# Patient Record
Sex: Male | Born: 1945
Health system: Southern US, Community
[De-identification: ages and names within clinical notes are randomized; demographics above are authoritative.]

## PROBLEM LIST (undated history)

## (undated) DIAGNOSIS — I10 Essential (primary) hypertension: Secondary | ICD-10-CM

## (undated) DIAGNOSIS — C4490 Unspecified malignant neoplasm of skin, unspecified: Secondary | ICD-10-CM

## (undated) DIAGNOSIS — N189 Chronic kidney disease, unspecified: Secondary | ICD-10-CM

## (undated) DIAGNOSIS — D352 Benign neoplasm of pituitary gland: Secondary | ICD-10-CM

## (undated) DIAGNOSIS — Z9889 Other specified postprocedural states: Secondary | ICD-10-CM

## (undated) DIAGNOSIS — M1990 Unspecified osteoarthritis, unspecified site: Secondary | ICD-10-CM

## (undated) DIAGNOSIS — K219 Gastro-esophageal reflux disease without esophagitis: Secondary | ICD-10-CM

## (undated) DIAGNOSIS — C61 Malignant neoplasm of prostate: Secondary | ICD-10-CM

## (undated) DIAGNOSIS — R112 Nausea with vomiting, unspecified: Secondary | ICD-10-CM

## (undated) HISTORY — DX: Malignant neoplasm of prostate: C61

## (undated) HISTORY — PX: OTHER SURGICAL HISTORY: SHX169

## (undated) HISTORY — DX: Unspecified malignant neoplasm of skin, unspecified: C44.90

## (undated) HISTORY — PX: CHOLECYSTECTOMY: SHX55

## (undated) HISTORY — PX: EYE SURGERY: SHX253

## (undated) HISTORY — PX: CARPAL TUNNEL WITH CUBITAL TUNNEL: SHX5608

---

## 2003-01-02 ENCOUNTER — Encounter: Payer: Self-pay | Admitting: Orthopaedic Surgery

## 2003-01-05 ENCOUNTER — Encounter: Payer: Self-pay | Admitting: Orthopaedic Surgery

## 2003-01-05 ENCOUNTER — Ambulatory Visit (HOSPITAL_COMMUNITY): Admission: RE | Admit: 2003-01-05 | Discharge: 2003-01-06 | Payer: Self-pay | Admitting: Orthopaedic Surgery

## 2003-01-05 HISTORY — PX: OTHER SURGICAL HISTORY: SHX169

## 2003-01-06 ENCOUNTER — Encounter: Payer: Self-pay | Admitting: Orthopaedic Surgery

## 2004-02-01 ENCOUNTER — Encounter: Admission: RE | Admit: 2004-02-01 | Discharge: 2004-02-01 | Payer: Self-pay | Admitting: Orthopedic Surgery

## 2004-02-06 ENCOUNTER — Ambulatory Visit (HOSPITAL_COMMUNITY): Admission: RE | Admit: 2004-02-06 | Discharge: 2004-02-06 | Payer: Self-pay | Admitting: Internal Medicine

## 2004-02-22 ENCOUNTER — Ambulatory Visit (HOSPITAL_COMMUNITY): Admission: RE | Admit: 2004-02-22 | Discharge: 2004-02-22 | Payer: Self-pay | Admitting: Orthopedic Surgery

## 2004-03-12 ENCOUNTER — Ambulatory Visit (HOSPITAL_COMMUNITY): Admission: RE | Admit: 2004-03-12 | Discharge: 2004-03-13 | Payer: Self-pay | Admitting: Orthopedic Surgery

## 2004-03-12 ENCOUNTER — Encounter (INDEPENDENT_AMBULATORY_CARE_PROVIDER_SITE_OTHER): Payer: Self-pay | Admitting: Specialist

## 2009-02-22 ENCOUNTER — Ambulatory Visit (HOSPITAL_COMMUNITY): Admission: RE | Admit: 2009-02-22 | Discharge: 2009-02-22 | Payer: Self-pay | Admitting: Ophthalmology

## 2009-03-15 ENCOUNTER — Ambulatory Visit (HOSPITAL_COMMUNITY): Admission: RE | Admit: 2009-03-15 | Discharge: 2009-03-15 | Payer: Self-pay | Admitting: Ophthalmology

## 2010-08-30 LAB — HEMOGLOBIN AND HEMATOCRIT, BLOOD
HCT: 42.4 % (ref 39.0–52.0)
Hemoglobin: 14.7 g/dL (ref 13.0–17.0)

## 2010-08-30 LAB — BASIC METABOLIC PANEL
Calcium: 8.9 mg/dL (ref 8.4–10.5)
Chloride: 107 mEq/L (ref 96–112)
Creatinine, Ser: 0.99 mg/dL (ref 0.4–1.5)
GFR calc Af Amer: >60 mL/min (ref >60)

## 2010-10-11 NOTE — H&P (Signed)
NAME:  Dwayne [name], Dwayne [name]                          ACCOUNT NO.:  [id number]   MEDICAL RECORD NO.:  [id number]                   PATIENT TYPE:  OIB   LOCATION:  5008                                 FACILITY:  [facility]   PHYSICIAN:  Sharolyn Douglas, M.D.                     DATE OF BIRTH:  1945/07/07   DATE OF ADMISSION:  01/05/2003  DATE OF DISCHARGE:                                HISTORY & PHYSICAL   CHIEF COMPLAINT:  Neck and right upper extremity pain as well as numbness.   HISTORY OF PRESENT ILLNESS:  The patient is a 65 year old male status post a  fall at work back in March of this year. He fell off a ladder. At that time,  he had immediate onset of neck pain and right upper extremity pain and also  numbness into his ring and small finger. This has been persisting since then  despite conservative treatments including anti-inflammatory medications,  pain medications, activity modification, rest, and physical therapy.  Unfortunately, he continues to have severe pain that is limiting his  activities of daily living, quality of life, and ability to work. Therefore,  risks and benefits of the proposed surgery were discussed with the patient  by Dr. Noel Gerold as well as myself who indicated understanding and opted to  proceed.   ALLERGIES:  No known drug allergies.   MEDICATIONS:  Bextra 20 mg q.d.   PAST MEDICAL HISTORY:  Healthy.   PAST SURGICAL HISTORY:  1. Knee scope in 1997.  2. Shoulder surgery, details are unknown, in 1997.  3. Cholecystectomy in 2003.   SOCIAL HISTORY:  The patient denies tobacco use, denies alcohol use. He is  married. Works as a Curator. He has three children who are grown. His wife  is available to help him through his postoperative course.   FAMILY HISTORY:  Noncontributory.   REVIEW OF SYSTEMS:  The patient denies any fevers, chills, sweats, or  bleeding tendencies. CENTRAL NERVOUS SYSTEM:  Denies blurred vision, double  vision, seizures, headaches, or  paralysis. CARDIOVASCULAR:  Denies chest  pain, angina, orthopnea, claudication, or palpitations. PULMONARY:  Denies  shortness of breath, productive cough, or hemoptysis. GASTROINTESTINAL:  Denies nausea, vomiting, constipation, diarrhea, melena, or bloody stool.  GENITOURINARY:  Denies dysuria, hematuria, or discharge. MUSCULOSKELETAL:  As per HPI.   PHYSICAL EXAMINATION:  VITAL SIGNS:  Blood pressure 168/94, respirations 16  and unlabored, pulse is 72 and regular.  GENERAL APPEARANCE:  The patient is a 65 year old male who is alert and  oriented in no acute distress, well-nourished, well-groomed, appears stated  age; pleasant and cooperative to examination.  HEENT:  Head is normocephalic, atraumatic. Pupils are equal, round, and  reactive to light. Extraocular movements intact. Nares patent. Oropharynx  clear.  NECK:  Supple to palpation. No lymphadenopathy, thyromegaly, or bruits  appreciated.  CHEST:  Clear to auscultation bilaterally. No rales, rhonchi,  stridor, or  wheeze.  BREASTS:  Not pertinent, not performed.  HEART:  S1 and S2, regular rate and rhythm; no murmurs, gallops, or rubs  noted.  ABDOMEN:  Soft to palpation, nontender, nondistended, no organomegaly noted,  positive bowel sounds throughout.  GENITOURINARY:  Not pertinent, not performed.  EXTREMITIES:  The patient has right upper extremity decreased sensation in  his ring and small finger. He also has pain into his right upper extremity,  decreased cervical range of motion.  SKIN:  Intact without lesions or rashes.   RADIOLOGIC FINDINGS:  X-ray and MRI shows cervical spondylitic  radiculopathy.   IMPRESSION:  Cervical spondylitic radiculopathy, C5-6.   PLAN:  1. Admit to [facility] hospital on January 05, 2003 for an ACDF at C5-6. This     will be done by Dr. Sharolyn Douglas.  2. The patient's primary care physician is Dr. Charm Barges.        Verlin Fester, P.A.                       Sharolyn Douglas, M.D.    CM/MEDQ   D:  01/05/2003  T:  01/06/2003  Job:  [id number]

## 2010-10-11 NOTE — Op Note (Signed)
NAME:  Dwayne [name], Dwayne [name]                          ACCOUNT NO.:  [id number]   MEDICAL RECORD NO.:  [id number]                   PATIENT TYPE:  AMB   LOCATION:  DAY                                  FACILITY:  [facility]   PHYSICIAN:  Lionel December, M.D.                 DATE OF BIRTH:  [birth year]   DATE OF PROCEDURE:  02/06/2004  DATE OF DISCHARGE:                                 OPERATIVE REPORT   PROCEDURE:  Esophagogastroduodenoscopy with esophageal dilation followed  total colonoscopy.   INDICATIONS:  Dwayne [name] is a 65 year old Caucasian male who has had solid food  dysphagia 3 weeks following neck surgery for disk problems in August last  year. He had barium study. He had small sliding hiatal hernia. Barium passed  through his esophagus without any delay. He has noted some improvement since  he has been on Aciphex. He is undergoing diagnostic and therapeutic EGD. He  will also undergo screening colonoscopy. Procedure and risks were reviewed  with the patient and informed consent was obtained.   PREOPERATIVE MEDICATIONS:  Cetacaine spray for pharyngeal topical  anesthesia, Demerol 50 mg IV, Versed 6 IV mg in divided doses.   FINDINGS:  Procedure performed in endoscopy suite. The patient's vital signs  and O2 saturations were monitored during the procedure and remained stable.   PROCEDURE #1:  Esophagogastroduodenoscopy. The patient was placed in left  lateral position, and Olympus video scope was passed via oropharynx without  any difficulty into the esophagus.   Esophagus:  Mucosa of the esophagus was normal. UES was carefully examined  on the way out, and no web or stricture was noted. A squamocolumnar  junction, and there was a 3-cm sized sliding hiatal hernia. There was no  ring or stricture noted distally.   Stomach:  It was empty and distended very well with insufflation. Folds of  the proximal stomach were normal. Examination of the mucosa revealed focal  erythema and edema at  antrum with no erosions or ulcers noted. Pyloric  channel was patent. Angularis, fundus, and cardia were examined by  retroflexing the scope and were normal.   Duodenum:  Reached the bulb and revealed normal mucosa. The scope was passed  to the second part of the duodenum. Mucosa and folds were normal. Endoscope  was withdrawn.   Esophagus was dilated by passing 56-French Maloney dilator to full  insertion. Endoscope was passed again, and esophageal mucosa reexamined.  There was no mucosa disruption.  Endoscope was withdrawn, and the patient  prepared for procedure #2.   PROCEDURE #2. Colonoscopy. Rectal examination performed. No abnormality  noted on external or digital exam. Olympus video scope was placed in the  rectum and advanced into sigmoid colon and beyond. Preparation was  satisfactory. He had a few tiny diverticula at the sigmoid colon and two  more at mid transverse colon. Scope was advanced to the cecum which was  identified  by appendiceal orifice and ileocecal valve. Picture taken for the  record. As the scope was withdrawn, colonic mucosa was once again carefully  examined and was normal except there was a tiny polyp at the sigmoid colon  which was ablated via cold biopsy. The mucosa of the rest of the sigmoid  colon and rectum was normal. Scope was retroflexed to examine anorectal  junction, and small hemorrhoids were noted below the dentate line.  The  scope was straightened and withdrawn. The patient tolerated the procedure  well.   FINAL DIAGNOSES:  1.  Small sliding hiatal hernia. No evidence of esophagitis, ring, or web.      Esophagus dilated by passing 56-French Maloney dilator because of      history of dysphagia. No mucosa disruption noted.  2.  Antral gastritis.  3.  Few tiny diverticula at the sigmoid colon and transverse colon. Small      polyp ablated via cold biopsy from the sigmoid colon.  4.  External hemorrhoids.   RECOMMENDATIONS:  1.  He will  continue antireflux measures and Aciphex as before. Helicobacter      pylori serology will be checked.  2.  High-fiber diet.  3.  I will be contacting patient with results of blood tests and biopsy in      the next few days.      ___________________________________________                                            Lionel December, M.D.   NR/MEDQ  D:  02/06/2004  T:  02/06/2004  Job:  [id number]

## 2010-10-11 NOTE — Op Note (Signed)
NAME:  Dwayne [name], Dwayne [name]                          ACCOUNT NO.:  [id number]   MEDICAL RECORD NO.:  [id number]                   PATIENT TYPE:  OIB   LOCATION:  2550                                 FACILITY:  [facility]   PHYSICIAN:  Sharolyn Douglas, M.D.                     DATE OF BIRTH:  [birth year]   DATE OF PROCEDURE:  01/05/2003  DATE OF DISCHARGE:                                 OPERATIVE REPORT   PREOPERATIVE DIAGNOSIS:  Cervical spondylotic radiculopathy, C5-6.   POSTOPERATIVE DIAGNOSIS:  Cervical spondylotic radiculopathy, C5-6.   OPERATION PERFORMED:  1. Anterior cervical diskectomy C5-6 with decompression of the spinal cord     and nerve roots bilaterally.  2. Anterior cervical arthrodesis C5-6 with placement of a 7 mm Synthes     allograft prosthesis spacer packed with local autogenous bone graft.  3. Anterior cervical plating utilizing the Zimmer plating system.  4. Neuromonitoring utilizing somatosensory evoked potentials and EMG's.   SURGEON:  Sharolyn Douglas, M.D.   ASSISTANT:  Verlin Fester, P.A.   ANESTHESIA:  General endotracheal.   COMPLICATIONS:  None.   INDICATIONS FOR PROCEDURE:  The patient is a 65 year old male with  persistent neck and right upper extremity pain, numbness and tingling and  weakness.  His plain radiograph showed severe degenerative changes at C5-6  with osteophyte formation.  MRI scan shows moderate central and biforaminal  narrowing.  He has failed all conservative treatment modalities.  He has  elected to undergo anterior cervical diskectomy and fusion at C5-6 in hopes  of improving his symptoms.  Risks, benefits and alternatives were  extensively discussed with the patient.   DESCRIPTION OF PROCEDURE:  The patient was properly identified in the  holding area and taken to the operating room.  He underwent general  endotracheal anesthesia without difficulty.  He was given prophylactic  intravenous antibiotics.  All bony prominences were padded.  He  was  carefully placed on the operating room table with the Mayfield head rest.  Slight extension was applied to the neck.  Five pounds of halter traction  was placed.  The neck was prepped and draped in the usual sterile fashion.  A 3 cm incision was made in the left side of the neck at the level of the  cricoid cartilage.  Dissection was carried down sharply through the  platysma.  The interval between the sternocleidomastoid muscle and strap  muscles medially was developed down to the prevertebral space.  The C5-6  disk space was easily identified by the large anterior osteophytes.  A  spinal needle was placed and an x-ray taken to confirm its location.  The  longus colli muscle was elevated out over the C5-6 disk space bilaterally.  The deep Shadowline retractor was placed.  The surgical microscope was  prepped, draped and brought into the field.  The esophagus, trachea, carotid  sheath were identified  and protected at all times.  The large anterior  osteophytes were debrided using a Leksell rongeur.  The disk space was  completely collapsed.  A diskectomy was carried out back to the posterior  longitudinal ligament using curets and pituitary rongeurs.  Caspar pins were  placed in C5 and 6 vertebral bodies and distraction was applied.  A high  speed bur was used to remove the cartilaginous end plates and take down the  large hypertrophied uncovertebral joints.  Wide foraminotomies were carried  out using 2 mm Kerrison punch.  The posterior longitudinal ligament was  taken down.  The spinal cord was decompressed.  The posterior osteophytes  were undercut using a 2 mm Kerrison punch.  The wound was irrigated.  Bleeding was controlled with bipolar electrocautery and Gelfoam.  A 7 mm  Synthes allograft prosthesis spacer was then placed which was packed with  local autogenous bone graft collected from the bone spurs.  A Zimmer  anterior cervical plate was placed with four 14 mm screws.  We  ensured that  the locking mechanism engaged.  The wound was irrigated.  The esophagus,  trachea and carotid sheath were inspected.  There were no apparent injuries.  Intraoperative x-ray taken showed good positioning of the graft and plate.  Deep Penrose drain placed.  The platysma muscle closed with a 2-0 Vicryl,  subcutaneous layer closed with 3-0 Vicryl followed by a running 4-0  subcuticular Vicryl suture, benzoin and Steri-Strips.  Sterile dressing  applied.  Soft collar placed.  Patient extubated without difficulty,  transferred to recovery room in stable condition able to move his upper and  lower extremities.                                                Sharolyn Douglas, M.D.    MC/MEDQ  D:  01/05/2003  T:  01/05/2003  Job:  [id number]

## 2010-10-11 NOTE — Op Note (Signed)
NAME:  Dwayne [name], Dwayne [name] NO.:  [id number]   MEDICAL RECORD NO.:  [id number]          PATIENT TYPE:  OIB   LOCATION:  2550                         FACILITY:  [facility]   PHYSICIAN:  Dionne Ano. Gramig III, M.D.DATE OF BIRTH:  [birth year]   DATE OF PROCEDURE:  03/12/2004  DATE OF DISCHARGE:                                 OPERATIVE REPORT   PREOPERATIVE DIAGNOSIS:  1.  Cubital tunnel syndrome, right upper extremity.  2.  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   POSTOPERATIVE DIAGNOSIS:  1.  Cubital tunnel syndrome, right upper extremity.  2.  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   PROCEDURE:  1.  Cubital tunnel release and anterior ulnar nerve transposition, right      elbow.  2.  Medial antebrachial cutaneous neurolysis, right elbow.  3.  Ulnar nerve release at Guyon's canal (ulnar nerve decompression at the      wrist).  4.  Leriche sympathectomy right wrist with stripping of the ulnar nerve away      from the ulnar artery and exploration.  5.  Ulnar artery thrombosis resection of right wrist forearm region.   SURGEON:  Dionne Ano. Amanda Pea, M.D.   ASSISTANT:  Karie Chimera, P.A.-C.   COMPLICATIONS:  None.   ANESTHESIA:  General with preop axillary block.   COMPLICATIONS:  None.   DRAINS:  One.   INDICATIONS FOR PROCEDURE:  This patient is a very pleasant male who  presents with the above mentioned diagnosis.  I have counseled him in  regards to the risks and benefits of surgery including the risks of  infection, bleeding, anesthesia, damage to normal structures, and failure of  surgery to accomplish its intended goals.  I have gone over his pre and  postop treatment with him, possible treatment scenarios based upon  interoperative conditions, etc.  With this in mind, he desires to proceed.   PROCEDURE IN DETAIL:  The patient was seen by myself and anesthesia, taken  to the operating suite, and underwent  a smooth induction of general  anesthesia.  He was laid supine, appropriately padded, prepped and draped in  the usual sterile fashion with Betadine scrub and paint.  Once this was  done, the patient then underwent elevation of the tourniquet to 250 mmHg.  I  then performed an extended volar wrist incision ulnarly.  This crossed the  wrist and was done in zig-zag fashion.  I then identified the ulnar artery  and the ulnar nerve proximally.  The ulnar artery was then traced out from  proximal to distal well into the superficial arch.  I identified the ulnar  artery and during the course of this performed an ulnar nerve release at  Guyon's canal.  The patient had very tight fascial bands about the canal of  Guyon.  This was released without difficulty.  Following this, I then  mobilized the nerve.  I took care to resect the superficial branch,  primarily the ulnar digital nerve to the small finger.  Once the nerve was  decompressed, I then performed an Leriche  sympathectomy teasing away any  investments of the nerve from the ulnar artery.  Any crossing and transverse  feeders off the artery were coagulated.  The patient had a notable  thrombosis from the area of 4 cm.  The thrombus did not extend deep into the  superficial palmar arch.  I dissected the artery out until the arch was  apparent and once this was done, I then immobilized the artery with vessel  loops.  The Leriche sympathectomy was accomplished without difficulty and  all investments were removed from the artery.  Following this, I then  deflated the tourniquet.  The patient had excellent refill.  He had pulses  which were noted on Doppler about the small finger and ring finger which  were good and stable.  I was pleased with this and felt that given the fact  that the patient had good refill, excellent vascular flow, and pulses noted  by Doppler about the proper digital arteries, that a Leriche sympathectomy  procedure was the  best choice as well as resection of the ulnar artery.  I  then resected the ulnar artery along a 3-4 cm course.  This was totally  occluded.  The patient did have vessel clamps placed prior to this and I  noted there was adequate flow prior to resecting the artery.  The artery was  resected without difficulty.  The flow in the upper extremity was robust  about all fingertips and he had good pulses on Doppler.  Following this, I  then copiously irrigated and closed the wound with interrupted Prolene.  A  drain was not needed as hemostasis was excellent.  Following this, we then  performed elevation of the arm tourniquet to 250 mmHg.  An incision was made  posteromedially, a skin flap was swung up.  The patient did have some gap  between the olecranon and the posterior structures due to old bursal tissue  being present there.  I then mobilized an anterior skin flap and identified  the medial and the brachial cutaneous nerve, and performed a neurolysis of  the medial and brachial cutaneous nerve.  Multiple branches were identified  and swept out of harms way.  Once this was done, the ulnar nerve was picked  up proximally and released proximally and distally.  The arcade of Struthers  was released, the medial muscular septum was released and excised, the  cubital tunnel was released, and the superficial and deep heads of the FCU  were released.  This was done to my satisfaction without difficulty and  there were no complicating features.  Following this, we then performed  anterior ulnar nerve transposition.  This was done very gently and  meticulously.  The cubital tunnel was sewn closed with Vicryl as was the  dead space between the ulnar bursa and the olecranon.  Following this, I  copiously irrigated, deflated the tourniquet, excellent refill was noted,  pulses remained excellent about the fingers, and I then placed a Hemovac drain and closed the subcu.  The nerve sat tension free along the  flexor  pronator region and there were no complicating features.  I did tack down  the subcu to prevent subluxation and, of course, the cubital tunnel was  closed to prevent subluxation.  Following this, the wound was closed with  Vicryl followed by a subcuticular Prolene at the elbow.  Onc this was done,  a dressing was applied and the patient was placed in a splint without  difficulty.  He was transferred to the recovery room and given an additional  dose of Ancef and was noted to be in stable condition.  All sponge, needle  and instrument counts were reported as correct.  It has been a pleasure to  participate in his care and we look forward to participating in his  postoperative recovery.  He will be admitted for IV antibiotics, elevation,  pain management, and other postoperative needs.  All questions have been  encouraged and answered.       WMG/MEDQ  D:  03/12/2004  T:  03/12/2004  Job:  [id number]

## 2010-10-11 NOTE — Consult Note (Signed)
NAME:  Dwayne [name], Dwayne [name] NO.:  [id number]   MEDICAL RECORD NO.:  [id number]                   PATIENT TYPE:   LOCATION:                                       FACILITY:   PHYSICIAN:  Lionel December, M.D.                 DATE OF BIRTH:  Oct 17, 1945   DATE OF CONSULTATION:  DATE OF DISCHARGE:                                   CONSULTATION   GI CONSULTATION:   PHYSICIAN REQUESTING CONSULTATION:  Dr. Samuel Jester.   REASON FOR CONSULTATION:  Dysphagia, screening colonoscopy.   HISTORY OF PRESENT ILLNESS:  Dwayne [name] is a 65 year old Caucasian gentleman  who presents today with a several-month history of progressive dysphagia.  He has difficulty swallowing mostly solids foods and occasionally liquids.  When the symptoms occur, he feels food stuck in his upper esophageal region  and has to wait for it to go down.  Occasionally, he vomits this back up.  He is unable to wash it down with liquids at times.  He denies any  odynophagia, heartburn, nausea, abdominal pain, constipation, diarrhea,  melena or rectal bleeding.  He used to have acid regurgitation several years  ago, but really has not had much problem in the way of this.  He had a  barium pill esophogram on January 03, 2004, which revealed a slight amount of  penetration, but no aspiration.  He had a small hiatal hernia which was non-  obstructing.  A 13 mm barium tablet passed without difficulty.   CURRENT MEDICATIONS:  1.  Aciphex 20 mg daily.  2.  Topamax 100 mg daily.   ALLERGIES:  No known drug allergies.   PAST MEDICAL HISTORY:  History of migraines since work-related accident  where he fell on his head from a ladder.   PAST SURGICAL HISTORY:  He has had cervical disk surgery in August, 2004,  right rotator cuff repair seven years ago with repeat right shoulder surgery  in January of 2005 in which he was found to have a remote fracture due to a  work-related fall.   FAMILY HISTORY:   Mother had an aneurysm.  Father died of an MI.  Negative  for colorectal cancer and chronic GI illnesses.   SOCIAL HISTORY:  He has been married for 41 years and had three children.  He is employed with [employer].  He quit smoking 15-20 years  ago, with history of 15 years abuse.  He denies any alcohol use.   REVIEW OF SYSTEMS:  Please see HPI for GI.  CARDIOPULMONARY:  Denies any  chest pain or shortness of breath.   PHYSICAL EXAMINATION:  VITAL SIGNS:  Weight 206, height 5 feet, 8 inches.  Blood pressure 113/82, pulse 78.  GENERAL:  A pleasant, well-nourished, well-developed Caucasian male, in no  acute distress.  SKIN:  Warm and dry, no jaundice.  HEENT:  Conjunctivae are pink.  Sclerae are nonicteric. Oropharyngeal mucosa  moist and pink.  No lesions, erythema or exudate.  No lymphadenopathy or  thyromegaly.  CHEST:  Lungs are clear to auscultation.  CARDIAC:  Exam reveals regular rate and rhythm, normal S1 and S2.  No  murmurs, rubs or gallops.  ABDOMEN:  Positive bowel sounds, soft, nontender, nondistended.  No  organomegaly or masses.  EXTREMITIES:  No edema.   IMPRESSION:  Dwayne [name] is a 65 year old gentleman with a several month  history of worsening dysphagia primarily to solid foods.  Recent barium  esophogram did not reveal any esophageal strictures.  His symptoms were  occurring fairly regularly.  At times, he vomits food after it becomes  lodged.  It is unclear at this time of the cause of his symptoms.  Esophageal spasm, hypertensive upper esophageal sphincter, undetected  esophageal web ring stricture all remain a possibility.  In addition, he has  never had a colonoscopy for colorectal cancer screen, and he would like to  proceed with this at this time.  I discussed the risks and alternatives of  esophagogastroduodenoscopy (EGD) and colonoscopy with the patient, and he is  agreeable to proceed.   PLAN:  1.  He will continue Aciphex for now.  2.   Esophagogastroduodenoscopy with dilatation and colonoscopy in the near      future.   I would like to thank Dr. Samuel Jester for allowing [place] to take part in the  care of this patient.     ________________________________________  ___________________________________________  Tana Coast, P.A.                         Lionel December, M.D.   LL/MEDQ  D:  01/30/2004  T:  01/30/2004  Job:  [id number]   cc:   [address]  [place]  [place] [address]  Fax: [phone]

## 2011-06-09 ENCOUNTER — Other Ambulatory Visit: Payer: Self-pay | Admitting: Urology

## 2011-07-07 ENCOUNTER — Encounter (HOSPITAL_COMMUNITY): Payer: Self-pay | Admitting: Pharmacy Technician

## 2011-07-09 ENCOUNTER — Encounter (HOSPITAL_COMMUNITY)
Admission: RE | Admit: 2011-07-09 | Discharge: 2011-07-09 | Disposition: A | Discharge status: Home / Self Care | Payer: Medicare Other | Source: Ambulatory Visit | Attending: Urology | Admitting: Urology

## 2011-07-09 ENCOUNTER — Encounter (HOSPITAL_COMMUNITY): Payer: Self-pay

## 2011-07-09 HISTORY — DX: Other specified postprocedural states: Z98.890

## 2011-07-09 HISTORY — DX: Chronic kidney disease, unspecified: N18.9

## 2011-07-09 HISTORY — DX: Other specified postprocedural states: R11.2

## 2011-07-09 HISTORY — DX: Gastro-esophageal reflux disease without esophagitis: K21.9

## 2011-07-09 LAB — BASIC METABOLIC PANEL
BUN: 26 mg/dL — ABNORMAL HIGH (ref 6–23)
Calcium: 9 mg/dL (ref 8.4–10.5)
Creatinine, Ser: 0.84 mg/dL (ref 0.50–1.35)
GFR calc non Af Amer: 90 mL/min — ABNORMAL LOW (ref >90)
Glucose, Bld: 93 mg/dL (ref 70–99)

## 2011-07-09 LAB — CBC
MCH: 29.4 pg (ref 26.0–34.0)
MCHC: 34.5 g/dL (ref 30.0–36.0)
Platelets: 181 10*3/uL (ref 150–400)

## 2011-07-09 NOTE — Pre-Procedure Instructions (Signed)
07/09/11 Reported abnormal lab result to office of Dr Isabel Caprice regarding BUN of 26.

## 2011-07-09 NOTE — Patient Instructions (Signed)
20 CIAN COSTANZO  07/09/2011   Your procedure is scheduled on:  07/16/11 0830am-1130am  Report to [facility] at 0630 AM.  Call this number if you have problems the morning of surgery: [phone]   Remember:   Do not eat food:After Midnight.  May have clear liquids:until Midnight .  Marland Kitchen  Take these medicines the morning of surgery with A SIP OF WATER:    Do not wear jewelry,   Do not wear lotions, powders, or perfumes. .    Do not bring valuables to the hospital.  Contacts, dentures or bridgework may not be worn into surgery.  Leave suitcase in the car. After surgery it may be brought to your room.  For patients admitted to the hospital, checkout time is 11:00 AM the day of discharge.       Special Instructions: CHG Shower Use Special Wash: 1/2 bottle night before surgery and 1/2 bottle morning of surgery. Shower chin to [employer] and private parts with regular soap.     Please read over the following fact sheets that you were given: MRSA Information, Blood Transfusion Fact Sheet, coughing and deep breathing exercises, leg exercises

## 2011-07-15 NOTE — H&P (Signed)
Dwayne [name] is an 66 y.o. male.   Chief Complaint: Prostate cancer HPI: Dwayne [name]  is thought to have intermediate stage clinical stage T1c disease. He did have one tiny biopsy showing Gleason 7 which put him in the intermediate category versus favorable, but really his situation tends towards the more favorable side. The patient really has minimal voiding complaints. He does have some urgency issues but overall AUA symptom score of about 6. Preoperative SHIM score is 23/25. He has read quite a bit about prostate cancer. He came in here with a bias towards surgery and expressed and initial interest in proceeding down that road.  Past Medical History  Diagnosis Date  . PONV (postoperative nausea and vomiting)   . GERD (gastroesophageal reflux disease)   . Chronic kidney disease     prostate cancer     Past Surgical History  Procedure Date  . Other surgical history     right shoulder surgery   . Other surgical history     right knee arthroscopic surgery   . Other surgical history     neck surgery   . Cholecystectomy     No family history on file. Social History:  reports that he has quit smoking. He has quit using smokeless tobacco. He reports that he does not drink alcohol or use illicit drugs.  Allergies: No Known Allergies  No current facility-administered medications on file as of .   No current outpatient prescriptions on file as of .    No results found for this or any previous visit (from the past 48 hour(s)). No results found.  Review of Systems - Negative except heartburn and mild depression.  There were no vitals taken for this visit. General appearance: alert, cooperative and no distress Neck: no adenopathy and no JVD Resp: clear to auscultation bilaterally Cardio: regular rate and rhythm GI: soft, non-tender; bowel sounds normal; no masses,  no organomegaly Male genitalia: normal, penis: no lesions or discharge. testes: no masses or tenderness. no  hernias Extremities: extremities normal, atraumatic, no cyanosis or edema Skin: Skin color, texture, turgor normal. No rashes or lesions Neurologic: Grossly normal  Assessment/Plan Prostate cancer. For robotic prostatectomy. The patient was counseled about the natural history of prostate cancer and the standard treatment options that are available for prostate cancer. It was explained to him how his age and life expectancy, clinical stage, Gleason score, and PSA affect his prognosis, the decision to proceed with additional staging studies, as well as how that information influences recommended treatment strategies. We discussed the roles for active surveillance, radiation therapy, surgical therapy, androgen deprivation, as well as ablative therapy options for the treatment of prostate cancer as appropriate to his individual cancer situation. We discussed the risks and benefits of these options with regard to their impact on cancer control and also in terms of potential adverse events, complications, and impact on quiality of life particularly related to urinary, bowel, and sexual function. The patient was encouraged to ask questions throughout the discussion today and all questions were answered to his stated satisfaction. In addition, the patient was provided with and/or directed to appropriate resources and literature for further education about prostate cancer and treatment options.   We discussed surgical therapy for prostate cancer including the different available surgical approaches. We discussed, in detail, the risks and expectations of surgery with regard to cancer control, urinary control, and erectile function as well as the expected postoperative recovery process. The risks, potential complications/adverse events of radical  prostatectomy as well as alternative options were explained to the patient.   We discussed surgical therapy for prostate cancer including the different available surgical  approaches. We discussed, in detail, the risks and expectations of surgery with regard to cancer control, urinary control, and erectile function as well as the expected postoperative recovery process. Additional risks of surgery including but not limited to bleeding, infection, hernia formation, nerve damage, lymphocele formation, bowel/rectal injury potentially necessitating colostomy, damage to the urinary tract resulting in urine leakage, urethral stricture, and the cardiopulmonary risks such as myocardial infarction, stroke, death, venothromboembolism, etc. were explained. The risk of open surgical conversion for robotic/laparoscopic prostatectomy was also discussed.   45 minutes were spent in face to face consultation with patient today.    Amendment  We had a lengthy discussion today about the pros and cons of radiation therapy versus surgery. If radiation were chosen I do think he would be a good candidate for brachytherapy as a monotherapy. He does have some very small Gleason 7 disease but a PSA of less than 10, relatively small prostate and low volume disease. Certainly he is also a candidate for active surveillance. Obviously some increased concern with a Gleason 7, but again, that was just a microscopic area really in one out of three biopsies. There are obviously distinctive areas and disadvantages to all of these approaches and we did highlight this today. At this point he is not certain about a surgical approach. He is interested in seeing how things go from an active surveillance standpoint at least mentally. Obviously if he changes his mind, we could certainly send him to radiation oncology for consideration of brachytherapy or put him on the surgical schedule for robotic prostatectomy. I would probably rebiopsy him at 6 months rather than 12 if he continues with active surveillance and if he would like to do that we will set him up for a follow-up in 3 months with a PSA one week before. After  that we would consider on his second visit repeat biopsy to make sure that pathologically there wasn't any sampling errors.   Dwayne [name] S 07/15/2011, 3:28 PM

## 2011-07-16 ENCOUNTER — Inpatient Hospital Stay (HOSPITAL_COMMUNITY)
Admission: RE | Admit: 2011-07-16 | Discharge: 2011-07-17 | DRG: 708 | Disposition: A | Discharge status: Home / Self Care | Payer: Medicare Other | Source: Ambulatory Visit | Attending: Urology | Admitting: Urology

## 2011-07-16 ENCOUNTER — Other Ambulatory Visit: Payer: Self-pay | Admitting: Urology

## 2011-07-16 ENCOUNTER — Encounter (HOSPITAL_COMMUNITY): Payer: Self-pay | Admitting: Anesthesiology

## 2011-07-16 ENCOUNTER — Encounter (HOSPITAL_COMMUNITY): Payer: Self-pay | Admitting: *Deleted

## 2011-07-16 ENCOUNTER — Encounter (HOSPITAL_COMMUNITY)
Admission: RE | Disposition: A | Discharge status: Home / Self Care | Payer: Self-pay | Source: Ambulatory Visit | Attending: Urology

## 2011-07-16 ENCOUNTER — Inpatient Hospital Stay (HOSPITAL_COMMUNITY): Payer: Medicare Other | Admitting: Anesthesiology

## 2011-07-16 DIAGNOSIS — Z87891 Personal history of nicotine dependence: Secondary | ICD-10-CM

## 2011-07-16 DIAGNOSIS — K219 Gastro-esophageal reflux disease without esophagitis: Secondary | ICD-10-CM | POA: Diagnosis present

## 2011-07-16 DIAGNOSIS — N189 Chronic kidney disease, unspecified: Secondary | ICD-10-CM | POA: Diagnosis present

## 2011-07-16 DIAGNOSIS — C61 Malignant neoplasm of prostate: Principal | ICD-10-CM | POA: Diagnosis present

## 2011-07-16 HISTORY — PX: ROBOT ASSISTED LAPAROSCOPIC RADICAL PROSTATECTOMY: SHX5141

## 2011-07-16 LAB — TYPE AND SCREEN: ABO/RH(D): O NEG

## 2011-07-16 SURGERY — ROBOTIC ASSISTED LAPAROSCOPIC RADICAL PROSTATECTOMY
Anesthesia: General | Site: Abdomen | Wound class: Clean Contaminated

## 2011-07-16 MED ORDER — SODIUM CHLORIDE 0.9 % IV BOLUS (SEPSIS)
1000.0000 mL | Freq: Once | INTRAVENOUS | Status: AC
  Administered 2011-07-16: 1000 mL via INTRAVENOUS

## 2011-07-16 MED ORDER — LACTATED RINGERS IV SOLN: INTRAVENOUS | Status: DC

## 2011-07-16 MED ORDER — ACETAMINOPHEN 10 MG/ML IV SOLN
1000.0000 mg | Freq: Four times a day (QID) | INTRAVENOUS | Status: AC
  Administered 2011-07-16 – 2011-07-17 (×4): 1000 mg via INTRAVENOUS
  Filled 2011-07-16 (×3): qty 100

## 2011-07-16 MED ORDER — CEFAZOLIN SODIUM 1-5 GM-% IV SOLN
1.0000 g | Freq: Three times a day (TID) | INTRAVENOUS | Status: AC
  Administered 2011-07-16 (×2): 1 g via INTRAVENOUS
  Filled 2011-07-16 (×2): qty 50

## 2011-07-16 MED ORDER — NEOSTIGMINE METHYLSULFATE 1 MG/ML IJ SOLN
INTRAMUSCULAR | Status: DC | PRN
  Administered 2011-07-16: 4 mg via INTRAVENOUS

## 2011-07-16 MED ORDER — SODIUM CHLORIDE 0.9 % IR SOLN
Status: DC | PRN
  Administered 2011-07-16: 250 mL

## 2011-07-16 MED ORDER — MORPHINE SULFATE 10 MG/ML IJ SOLN: 2.0000 mg | INTRAMUSCULAR | Status: DC | PRN

## 2011-07-16 MED ORDER — STERILE WATER FOR IRRIGATION IR SOLN
Status: DC | PRN
  Administered 2011-07-16: 1500 mL

## 2011-07-16 MED ORDER — LACTATED RINGERS IV SOLN
INTRAVENOUS | Status: DC | PRN
  Administered 2011-07-16: 09:00:00

## 2011-07-16 MED ORDER — MIDAZOLAM HCL 5 MG/5ML IJ SOLN
INTRAMUSCULAR | Status: DC | PRN
  Administered 2011-07-16: 2 mg via INTRAVENOUS

## 2011-07-16 MED ORDER — BUPIVACAINE-EPINEPHRINE 0.25% -1:200000 IJ SOLN
INTRAMUSCULAR | Status: DC | PRN
  Administered 2011-07-16: 26 mL

## 2011-07-16 MED ORDER — HYDROCODONE-ACETAMINOPHEN 5-325 MG PO TABS: 1.0000 | ORAL_TABLET | Freq: Four times a day (QID) | ORAL | Status: AC | PRN

## 2011-07-16 MED ORDER — KCL IN DEXTROSE-NACL 10-5-0.45 MEQ/L-%-% IV SOLN
INTRAVENOUS | Status: DC
  Administered 2011-07-16: 17:00:00 via INTRAVENOUS
  Filled 2011-07-16 (×6): qty 1000

## 2011-07-16 MED ORDER — EPHEDRINE SULFATE 50 MG/ML IJ SOLN
INTRAMUSCULAR | Status: DC | PRN
  Administered 2011-07-16: 5 mg via INTRAVENOUS

## 2011-07-16 MED ORDER — ONDANSETRON HCL 4 MG/2ML IJ SOLN
INTRAMUSCULAR | Status: DC | PRN
  Administered 2011-07-16: 4 mg via INTRAVENOUS

## 2011-07-16 MED ORDER — SODIUM CHLORIDE 0.9 % IV SOLN
1.5000 g | INTRAVENOUS | Status: AC
  Administered 2011-07-16: 1.5 g via INTRAVENOUS

## 2011-07-16 MED ORDER — PROPOFOL 10 MG/ML IV BOLUS
INTRAVENOUS | Status: DC | PRN
  Administered 2011-07-16: 120 mg via INTRAVENOUS

## 2011-07-16 MED ORDER — HYDROCODONE-ACETAMINOPHEN 5-325 MG PO TABS: 1.0000 | ORAL_TABLET | ORAL | Status: DC | PRN

## 2011-07-16 MED ORDER — CIPROFLOXACIN HCL 500 MG PO TABS: 500.0000 mg | ORAL_TABLET | Freq: Two times a day (BID) | ORAL | Status: DC

## 2011-07-16 MED ORDER — ACETAMINOPHEN 10 MG/ML IV SOLN
INTRAVENOUS | Status: DC | PRN
  Administered 2011-07-16: 1000 mg via INTRAVENOUS

## 2011-07-16 MED ORDER — ROCURONIUM BROMIDE 100 MG/10ML IV SOLN
INTRAVENOUS | Status: DC | PRN
  Administered 2011-07-16: 5 mg via INTRAVENOUS
  Administered 2011-07-16: 50 mg via INTRAVENOUS
  Administered 2011-07-16 (×2): 5 mg via INTRAVENOUS
  Administered 2011-07-16: 20 mg via INTRAVENOUS

## 2011-07-16 MED ORDER — GLYCOPYRROLATE 0.2 MG/ML IJ SOLN
INTRAMUSCULAR | Status: DC | PRN
  Administered 2011-07-16: .5 mg via INTRAVENOUS

## 2011-07-16 MED ORDER — LACTATED RINGERS IV SOLN
INTRAVENOUS | Status: DC
  Administered 2011-07-16: 13:00:00 via INTRAVENOUS

## 2011-07-16 MED ORDER — HYDROMORPHONE HCL PF 1 MG/ML IJ SOLN
0.2500 mg | INTRAMUSCULAR | Status: DC | PRN
  Administered 2011-07-16 (×2): 0.25 mg via INTRAVENOUS

## 2011-07-16 MED ORDER — DEXAMETHASONE SODIUM PHOSPHATE 10 MG/ML IJ SOLN
INTRAMUSCULAR | Status: DC | PRN
  Administered 2011-07-16: 10 mg via INTRAVENOUS

## 2011-07-16 MED ORDER — INDIGOTINDISULFONATE SODIUM 8 MG/ML IJ SOLN
INTRAMUSCULAR | Status: DC | PRN
  Administered 2011-07-16 (×2): 5 mL via INTRAVENOUS

## 2011-07-16 MED ORDER — LACTATED RINGERS IV SOLN
INTRAVENOUS | Status: DC | PRN
  Administered 2011-07-16 (×2): via INTRAVENOUS

## 2011-07-16 MED ORDER — PANTOPRAZOLE SODIUM 40 MG PO TBEC
40.0000 mg | DELAYED_RELEASE_TABLET | Freq: Every day | ORAL | Status: DC
  Filled 2011-07-16: qty 1

## 2011-07-16 MED ORDER — PROMETHAZINE HCL 25 MG/ML IJ SOLN: 6.2500 mg | INTRAMUSCULAR | Status: DC | PRN

## 2011-07-16 MED ORDER — FENTANYL CITRATE 0.05 MG/ML IJ SOLN
INTRAMUSCULAR | Status: DC | PRN
  Administered 2011-07-16 (×7): 50 ug via INTRAVENOUS
  Administered 2011-07-16: 100 ug via INTRAVENOUS
  Administered 2011-07-16: 50 ug via INTRAVENOUS

## 2011-07-16 SURGICAL SUPPLY — 44 items
APPLICATOR SURGIFLO ENDO (HEMOSTASIS) ×2 IMPLANT
CANISTER SUCTION 2500CC (MISCELLANEOUS) ×2 IMPLANT
CATH FOLEY 2WAY SLVR  5CC 20FR (CATHETERS) ×1
CATH FOLEY 2WAY SLVR 5CC 20FR (CATHETERS) ×1 IMPLANT
CATH ROBINSON RED A/P 8FR (CATHETERS) ×2 IMPLANT
CHLORAPREP W/TINT 26ML (MISCELLANEOUS) ×2 IMPLANT
CLIP LIGATING HEM O LOK PURPLE (MISCELLANEOUS) IMPLANT
CLOTH BEACON ORANGE TIMEOUT ST (SAFETY) ×2 IMPLANT
CORD HIGH FREQUENCY UNIPOLAR (ELECTROSURGICAL) ×2 IMPLANT
COVER SURGICAL LIGHT HANDLE (MISCELLANEOUS) ×2 IMPLANT
COVER TIP SHEARS 8 DVNC (MISCELLANEOUS) ×1 IMPLANT
COVER TIP SHEARS 8MM DA VINCI (MISCELLANEOUS) ×1
CUTTER ECHEON FLEX ENDO 45 340 (ENDOMECHANICALS) ×2 IMPLANT
DECANTER SPIKE VIAL GLASS SM (MISCELLANEOUS) ×2 IMPLANT
DRAPE SURG IRRIG POUCH 19X23 (DRAPES) ×2 IMPLANT
DRAPE UTILITY 15X26 (DRAPE) ×2 IMPLANT
DRSG TEGADERM 6X8 (GAUZE/BANDAGES/DRESSINGS) IMPLANT
ELECT REM PT RETURN 9FT ADLT (ELECTROSURGICAL) ×2
ELECTRODE REM PT RTRN 9FT ADLT (ELECTROSURGICAL) ×1 IMPLANT
FLOSEAL 10ML (HEMOSTASIS) ×2 IMPLANT
GLOVE BIO SURGEON STRL SZ 6.5 (GLOVE) ×4 IMPLANT
GLOVE BIOGEL M STRL SZ7.5 (GLOVE) ×2 IMPLANT
GOWN PREVENTION PLUS XLARGE (GOWN DISPOSABLE) ×2 IMPLANT
GOWN STRL NON-REIN LRG LVL3 (GOWN DISPOSABLE) ×2 IMPLANT
GOWN STRL REIN XL XLG (GOWN DISPOSABLE) ×2 IMPLANT
HEMOSTAT SURGICEL 4X8 (HEMOSTASIS) ×2 IMPLANT
HOLDER FOLEY CATH W/STRAP (MISCELLANEOUS) ×2 IMPLANT
IV LACTATED RINGERS 1000ML (IV SOLUTION) ×2 IMPLANT
KIT ACCESSORY DA VINCI DISP (KITS) ×1
KIT ACCESSORY DVNC DISP (KITS) ×1 IMPLANT
NDL SAFETY ECLIPSE 18X1.5 (NEEDLE) ×1 IMPLANT
NEEDLE HYPO 18GX1.5 SHARP (NEEDLE) ×1
PACK ROBOT UROLOGY CUSTOM (CUSTOM PROCEDURE TRAY) ×2 IMPLANT
RELOAD GREEN ECHELON 45 (STAPLE) ×2 IMPLANT
SEALER TISSUE G2 CVD JAW 45CM (ENDOMECHANICALS) IMPLANT
SET TUBE IRRIG SUCTION NO TIP (IRRIGATION / IRRIGATOR) ×2 IMPLANT
SOLUTION ELECTROLUBE (MISCELLANEOUS) ×2 IMPLANT
SPONGE GAUZE 4X4 12PLY (GAUZE/BANDAGES/DRESSINGS) ×2 IMPLANT
SUT VIC AB 2-0 SH 27 (SUTURE) ×2
SUT VIC AB 2-0 SH 27X BRD (SUTURE) ×2 IMPLANT
SUT VICRYL 0 UR6 27IN ABS (SUTURE) ×2 IMPLANT
SYR 27GX1/2 1ML LL SAFETY (SYRINGE) ×2 IMPLANT
TOWEL OR NON WOVEN STRL DISP B (DISPOSABLE) ×2 IMPLANT
WATER STERILE IRR 1500ML POUR (IV SOLUTION) ×4 IMPLANT

## 2011-07-16 NOTE — Transfer of Care (Signed)
Immediate Anesthesia Transfer of Care Note  Patient: Dwayne [name]  Procedure(s) Performed: Procedure(s) (LRB): ROBOTIC ASSISTED LAPAROSCOPIC RADICAL PROSTATECTOMY (N/A)  Patient Location: PACU  Anesthesia Type: General  Level of Consciousness: awake, alert , oriented and patient cooperative  Airway & Oxygen Therapy: Patient Spontanous Breathing and Patient connected to face mask oxygen  Post-op Assessment: Report given to PACU RN and Post -op Vital signs reviewed and stable  Post vital signs: Reviewed and stable  Complications: No apparent anesthesia complications

## 2011-07-16 NOTE — Discharge Instructions (Signed)
1. Activity:  You are encouraged to ambulate frequently (about every hour during waking hours) to help prevent blood clots from forming in your legs or lungs.  However, you should not engage in any heavy lifting (> 10-15 lbs), strenuous activity, or straining. °2. Diet: You should continue a clear liquid diet until passing gas from below.  Once this occurs, you may advance your diet to a soft diet that would be easy to digest (i.e soups, scrambled eggs, mashed potatoes, etc.) for 24 hours just as you would if getting over a bad stomach flu.  If tolerating this diet well for 24 hours, you may then begin eating regular food.  It will be normal to have some amount of bloating, nausea, and abdominal discomfort intermittently. °3. Prescriptions:  You will be provided a prescription for pain medication to take as needed.  If your pain is not severe enough to require the prescription pain medication, you may take extra strength Tylenol instead.  You should also take an over the counter stool softener (Colace 100 mg twice daily) to avoid straining with bowel movements as the pain medication may constipate you. Finally, you will also be provided a prescription for an antibiotic to begin the day prior to your return visit in the office for catheter removal. °4. Catheter care: You will be taught how to take care of the catheter by the nursing staff prior to discharge from the hospital.  You may use both a leg bag and the larger bedside bag but it is recommended to at least use the bigger bedside bag at nighttime as the leg bag is small and will fill up overnight and also does not drain as well when lying flat. You may periodically feel a strong urge to void with the catheter in place.  This is a bladder spasm and most often can occur when having a bowel movement or when you are moving around. It is typically self-limited and usually will stop after a few minutes.  You may use some Vaseline or Neosporin around the tip of the  catheter to reduce friction at the tip of the penis. °5. Incisions: You may remove your dressing bandages the 2nd day after surgery.  You most likely will have a few small staples in each of the incisions and once the bandages are removed, the incisions may stay open to air.  You may start showering (not soaking or bathing in water) 48 hours after surgery and the incisions simply need to be patted dry after the shower.  No additional care is needed. °6. What to call us about: You should call the office ([phone]) if you develop fever > 101, persistent vomiting, or the catheter stops draining. Also, feel free to call with any other questions you may have and remember the handout that was provided to you as a reference preoperatively which answers many of the common questions that arise after surgery. ° °You may resume aspirin 7 days after surgery. °

## 2011-07-16 NOTE — Anesthesia Postprocedure Evaluation (Signed)
  Anesthesia Post-op Note  Patient: Dwayne [name]  Procedure(s) Performed: Procedure(s) (LRB): ROBOTIC ASSISTED LAPAROSCOPIC RADICAL PROSTATECTOMY (N/A)  Patient Location: PACU  Anesthesia Type: General  Level of Consciousness: oriented and sedated  Airway and Oxygen Therapy: Patient Spontanous Breathing and Patient connected to nasal cannula oxygen  Post-op Pain: mild  Post-op Assessment: Post-op Vital signs reviewed, Patient's Cardiovascular Status Stable, Respiratory Function Stable and Patent Airway  Post-op Vital Signs: stable  Complications: No apparent anesthesia complications

## 2011-07-16 NOTE — Preoperative (Signed)
Beta Blockers   Reason not to administer Beta Blockers:Not Applicable 

## 2011-07-16 NOTE — Anesthesia Preprocedure Evaluation (Signed)
Anesthesia Evaluation  Patient identified by MRN, date of birth, ID band Patient awake    Reviewed: Allergy & Precautions, H&P , NPO status , Patient's Chart, lab work & pertinent test results, reviewed documented beta blocker date and time   History of Anesthesia Complications (+) PONV  Airway Mallampati: II TM Distance: >3 FB Neck ROM: Full    Dental  (+) Teeth Intact   Pulmonary neg pulmonary ROS,  clear to auscultation        Cardiovascular neg cardio ROS Regular Normal Denies cardiac symptoms   Neuro/Psych Negative Neurological ROS  Negative Psych ROS   GI/Hepatic negative GI ROS, Neg liver ROS,   Endo/Other  Negative Endocrine ROS  Renal/GU negative Renal ROS   Hx prostate cancer    Musculoskeletal negative musculoskeletal ROS (+)   Abdominal   Peds negative pediatric ROS (+)  Hematology negative hematology ROS (+)   Anesthesia Other Findings   Reproductive/Obstetrics negative OB ROS                           Anesthesia Physical Anesthesia Plan  ASA: II  Anesthesia Plan: General   Post-op Pain Management:    Induction: Intravenous  Airway Management Planned: Oral ETT  Additional Equipment:   Intra-op Plan:   Post-operative Plan: Extubation in OR  Informed Consent: I have reviewed the patients History and Physical, chart, labs and discussed the procedure including the risks, benefits and alternatives for the proposed anesthesia with the patient or authorized representative who has indicated his/her understanding and acceptance.   Dental advisory given  Plan Discussed with: CRNA and Surgeon  Anesthesia Plan Comments:         Anesthesia Quick Evaluation

## 2011-07-16 NOTE — Interval H&P Note (Signed)
History and Physical Interval Note:  07/16/2011 8:18 AM  Dwayne [name]  has presented today for surgery, with the diagnosis of Prostate Cancer  The various methods of treatment have been discussed with the patient and family. After consideration of risks, benefits and other options for treatment, the patient has consented to  Procedure(s) (LRB): ROBOTIC ASSISTED LAPAROSCOPIC RADICAL PROSTATECTOMY (N/A) as a surgical intervention .  The patients' history has been reviewed, patient examined, no change in status, stable for surgery.  I have reviewed the patients' chart and labs.  Questions were answered to the patient's satisfaction.     [name] S

## 2011-07-17 LAB — BASIC METABOLIC PANEL
CO2: 25 mEq/L (ref 19–32)
Calcium: 8.6 mg/dL (ref 8.4–10.5)
Chloride: 107 mEq/L (ref 96–112)
Glucose, Bld: 129 mg/dL — ABNORMAL HIGH (ref 70–99)
Sodium: 140 mEq/L (ref 135–145)

## 2011-07-17 LAB — HEMOGLOBIN AND HEMATOCRIT, BLOOD
HCT: 37.5 % — ABNORMAL LOW (ref 39.0–52.0)
Hemoglobin: 13 g/dL (ref 13.0–17.0)

## 2011-07-17 MED ORDER — HYDROCODONE-ACETAMINOPHEN 5-325 MG PO TABS: 1.0000 | ORAL_TABLET | Freq: Four times a day (QID) | ORAL | Status: DC | PRN

## 2011-07-17 MED ORDER — BISACODYL 10 MG RE SUPP
10.0000 mg | Freq: Once | RECTAL | Status: DC
  Filled 2011-07-17: qty 1

## 2011-07-17 MED ORDER — CIPROFLOXACIN HCL 500 MG PO TABS: 500.0000 mg | ORAL_TABLET | Freq: Two times a day (BID) | ORAL | Status: AC

## 2011-07-17 MED ORDER — HYDROCODONE-ACETAMINOPHEN 5-325 MG PO TABS: 1.0000 | ORAL_TABLET | Freq: Four times a day (QID) | ORAL | Status: AC | PRN

## 2011-07-17 NOTE — Op Note (Signed)
Preoperative diagnosis: Clinical stage T1c Adenocarcinoma prostate  Postoperative diagnosis: Same  Procedure: Robotic-assisted laparoscopic radical retropubic prostatectomy  Surgeon: Valetta Fuller, MD  Asst.: Pecola Leisure, PA Anesthesia: Gen. Endotracheal  Indications: Patient was diagnosed with clinical stage TIc Adenocarcinoma the prostate. He underwent extensive consultation with regard to treatment options. The patient decided on a surgical approach. He appeared to understand the distinct advantages as well as the disadvantages of this procedure. The patient has performed a mechanical bowel prep. He has had placement of PAS compression boots and has received perioperative antibiotics. The patient's preoperative PSA was 6.5. Ultrasound revealed a 40 g prostate.   Technique and findings:The patient was brought to the operating room and had successful induction of general endotracheal anesthesia.the patient was placed in a low lithotomy position with careful padding of all extremities. He was secured to the operative table and placed in the steep Trendelenburg position. He was prepped and draped in usual manner. A Foley catheter was placed sterilely on the field. Camera port site was chosen 18 cm above the pubic symphysis just to the left of the umbilicus. A standard open Hassan technique was utilized. A 12 mm trocar was placed without difficulty. The camera was then inserted and no abnormalities were noted within the pelvis. The trochars were placed with direct visual guidance. This included 3 8mm robotic trochars and a 12 mm and 5 mm assist ports. Once all the ports were placed the robot was docked. The bladder was filled and the space of Retzius was developed with electrocautery dissection as well as blunt dissection. Superficial fat off the endopelvic fascia and bladder neck was removed with electrocautery scissors. The endopelvic fascia was then incised bilaterally from base to apex. Levator  musculature was swept off the apex of the prostate isolating the dorsal venous complex which was then stapled with the ETS stapling device. The anterior bladder neck was identified with the aid of the Foley balloon. This was then transected down to the Foley catheter with electrocautery scissors. The Foley catheter was then retracted anteriorly. Indigo carmine was given and we appeared to be well away from the ureteral orifices. The posterior bladder neck was then transected and the dissection carried down to the adnexal structures. The seminal vesicles and vas deferens on both sides were then individually dissected free and retracted anteriorly. The posterior plane between the rectum and prostate was then established primarily with blunt dissection.  Attention was then turned towards nerve sparing. The patient was felt to be a candidate for bilateral nerve sparing. Superficial fascia along the anterior lateral aspect of the prostate was incised bilaterally. This tissue was then swept laterally until we were able to establish a groove between the neurovascular tissue and the posterior lateral aspect on the prostate bilaterally. This groove was then extended from the apex back to the base of the prostate. With the prostate retracted anteriorly the vascular pedicles of the prostate were taken with the Enseal device. The Foley catheter was then reinserted and the anterior urethra was transected. The posterior urethra was then transected as were some rectourethralis fibers. The prostate was then removed from the pelvis. The pelvis was then copiously irrigated. Rectal insufflation was performed and there was no evidence of rectal injury.    Attention was then turned towards reconstruction. The bladder neck did not require any reconstruction. The bladder neck and posterior urethra were reapproximated at the 6:00 position utilizing a 2-0 Vicryl suture. The rest of the anastomosis was done with a  double-armed 3-0  Monocryl suture in a 360 degree manner. Additional indigo carmine was given. A new catheter was placed and bladder irrigation revealed no evidence of leakage. A Blake drain was placed through one of the robotic trochars and positioned in the retropubic space above the anastomosis. This was then secured to the skin with a nylon suture. The prostate was placed in the Endopouch retrieval bag. The 12 mm trocar site was closed with a Vicryl suture with the aid of a suture passer. Our other trochars were taken out with direct visual guidance without evidence of any bleeding. The camera port incision was extended slightly to allow for removal of the specimen and then closed with a running Vicryl suture. All port sites were infiltrated with Marcaine and then closed with surgical clips. The patient was then taken to recovery room having had no obvious complications or problems. Sponge and needle counts were correct.

## 2011-07-17 NOTE — Progress Notes (Signed)
Patient ID: Dwayne [name], male   DOB: 08/31/1945, 66 y.o.   MRN: [id number] 1 Day Post-Op Subjective: The patient is doing well.  No nausea or vomiting. Pain is adequately controlled.  Objective: Vital signs in last 24 hours: Temp:  [97 F (36.1 C)-98.7 F (37.1 C)] 97.9 F (36.6 C) (02/21 0557) Pulse Rate:  [59-90] 69  (02/21 0557) Resp:  [10-20] 18  (02/21 0557) BP: (129-176)/(64-94) 131/65 mmHg (02/21 0557) SpO2:  [94 %-100 %] 97 % (02/21 0557) Weight:  [90.719 kg (200 lb)] 90.719 kg (200 lb) (02/20 1501)  Intake/Output from previous day: 02/20 0701 - 02/21 0700 In: 5344.9 [P.O.:472; I.V.:3472.9; IV Piggyback:1400] Out: 4185 [Urine:3975; Drains:110; Blood:100] Intake/Output this shift: Total I/O In: 320 [P.O.:320] Out: 800 [Urine:800]  Physical Exam:  General: Alert and oriented. CV: RRR Lungs: Normal effort. GI: Soft, Nondistended. Incisions: Dressings intact. Urine: Clear Extremities: Nontender, no erythema, no edema.  Lab Results:  Basename 07/17/11 0455 07/16/11 1200  HGB 13.0 14.2  HCT 37.5* 40.8    Assessment/Plan: POD# 1 s/p robotic prostatectomy.  1) SL IVF 2) Ambulate, Incentive spirometry 3) Transition to oral pain medication 4) Dulcolax suppository 5) D/C pelvic drain 6) Plan for likely discharge later today   Valetta Fuller, MD

## 2011-07-17 NOTE — Discharge Summary (Signed)
Physician Discharge Summary  Patient ID: Dwayne [name] MRN: [id number] DOB/AGE: [birth year] 66 y.o.  Admit date: 07/16/2011 Discharge date: 07/17/2011  Admission Diagnoses:  Discharge Diagnoses:  Active Problems:  * No active hospital problems. *    Discharged Condition: good  Hospital Course:  Patient underwent shoulder while the prostatectomy for prostate cancer. Surgery was uncomplicated. Patient's postoperative course was unremarkable. Hemoglobin remains stable. Excellent urinary output with minimal JP drainage. The patient was able to tolerate clear liquids well. He was ambulating and otherwise doing well. He was discharged on postoperative day one after 23 hour observation. Consults: None  Significant Diagnostic Studies:None  Treatments: Patient underwent robotic-assisted laparoscopic radical Retropubic prostatectomy.  Discharge Exam: Blood pressure 131/65, pulse 69, temperature 97.9 F (36.6 C), temperature source Oral, resp. rate 18, height 5\' 9"  (1.753 m), weight 90.719 kg (200 lb), SpO2 97.00%. Well-developed well-nourished male in no acute distress. Respiratory effort normal. Heart regular rate and rhythm. Abdomen soft nontender. GU exam unremarkable. Extremities without tenderness or edema.  Disposition: 01-Home or Self Care   Medication List  As of 07/17/2011  4:25 PM   STOP taking these medications         aspirin EC 81 MG tablet         TAKE these medications         ciprofloxacin 500 MG tablet   Commonly known as: CIPRO   Take 1 tablet (500 mg total) by mouth 2 (two) times daily. Start day prior to office visit for foley removal      HYDROcodone-acetaminophen 5-325 MG per tablet   Commonly known as: NORCO   Take 1-2 tablets by mouth every 6 (six) hours as needed for pain.      HYDROcodone-acetaminophen 5-325 MG per tablet   Commonly known as: NORCO   Take 1-2 tablets by mouth every 6 (six) hours as needed for pain (pain).      omeprazole 20 MG capsule     Commonly known as: PRILOSEC   Take 20 mg by mouth daily.           Follow-up Information    Follow up with Valetta Fuller, MD on 07/25/2011. (at 8:45)    Contact information:   [address] [place], [address] [facility] [place] [address] [phone]          Signed: Valetta Fuller 07/17/2011, 4:25 PM

## 2011-07-28 ENCOUNTER — Encounter (HOSPITAL_COMMUNITY): Payer: Self-pay | Admitting: Urology

## 2013-03-02 ENCOUNTER — Other Ambulatory Visit: Payer: Self-pay | Admitting: Dermatology

## 2013-04-18 ENCOUNTER — Other Ambulatory Visit: Payer: Self-pay | Admitting: Dermatology

## 2014-09-05 ENCOUNTER — Other Ambulatory Visit: Payer: Self-pay | Admitting: Dermatology

## 2015-08-09 DIAGNOSIS — E785 Hyperlipidemia, unspecified: Secondary | ICD-10-CM | POA: Diagnosis not present

## 2015-08-09 DIAGNOSIS — Z86008 Personal history of in-situ neoplasm of other site: Secondary | ICD-10-CM | POA: Diagnosis not present

## 2015-08-09 DIAGNOSIS — Z79899 Other long term (current) drug therapy: Secondary | ICD-10-CM | POA: Diagnosis not present

## 2015-08-09 DIAGNOSIS — C4490 Unspecified malignant neoplasm of skin, unspecified: Secondary | ICD-10-CM | POA: Diagnosis not present

## 2015-08-09 DIAGNOSIS — E559 Vitamin D deficiency, unspecified: Secondary | ICD-10-CM | POA: Diagnosis not present

## 2015-09-10 DIAGNOSIS — Z8546 Personal history of malignant neoplasm of prostate: Secondary | ICD-10-CM | POA: Diagnosis not present

## 2015-09-10 DIAGNOSIS — Z0000 Encounter for general adult medical examination without abnormal findings: Secondary | ICD-10-CM | POA: Diagnosis not present

## 2015-09-10 DIAGNOSIS — N5201 Erectile dysfunction due to arterial insufficiency: Secondary | ICD-10-CM | POA: Diagnosis not present

## 2016-09-15 DIAGNOSIS — Z8546 Personal history of malignant neoplasm of prostate: Secondary | ICD-10-CM | POA: Diagnosis not present

## 2016-09-15 DIAGNOSIS — C61 Malignant neoplasm of prostate: Secondary | ICD-10-CM | POA: Diagnosis not present

## 2016-09-15 DIAGNOSIS — N5201 Erectile dysfunction due to arterial insufficiency: Secondary | ICD-10-CM | POA: Diagnosis not present

## 2016-10-16 DIAGNOSIS — Z0000 Encounter for general adult medical examination without abnormal findings: Secondary | ICD-10-CM | POA: Diagnosis not present

## 2016-10-16 DIAGNOSIS — I10 Essential (primary) hypertension: Secondary | ICD-10-CM | POA: Diagnosis not present

## 2016-10-16 DIAGNOSIS — Z6829 Body mass index (BMI) 29.0-29.9, adult: Secondary | ICD-10-CM | POA: Diagnosis not present

## 2016-10-16 DIAGNOSIS — K219 Gastro-esophageal reflux disease without esophagitis: Secondary | ICD-10-CM | POA: Diagnosis not present

## 2016-10-16 DIAGNOSIS — E782 Mixed hyperlipidemia: Secondary | ICD-10-CM | POA: Diagnosis not present

## 2017-09-15 DIAGNOSIS — N5201 Erectile dysfunction due to arterial insufficiency: Secondary | ICD-10-CM | POA: Diagnosis not present

## 2017-09-15 DIAGNOSIS — Z8546 Personal history of malignant neoplasm of prostate: Secondary | ICD-10-CM | POA: Diagnosis not present

## 2017-10-20 DIAGNOSIS — K219 Gastro-esophageal reflux disease without esophagitis: Secondary | ICD-10-CM | POA: Diagnosis not present

## 2017-10-20 DIAGNOSIS — E782 Mixed hyperlipidemia: Secondary | ICD-10-CM | POA: Diagnosis not present

## 2017-10-20 DIAGNOSIS — Z125 Encounter for screening for malignant neoplasm of prostate: Secondary | ICD-10-CM | POA: Diagnosis not present

## 2017-10-20 DIAGNOSIS — I10 Essential (primary) hypertension: Secondary | ICD-10-CM | POA: Diagnosis not present

## 2017-10-20 DIAGNOSIS — Z0000 Encounter for general adult medical examination without abnormal findings: Secondary | ICD-10-CM | POA: Diagnosis not present

## 2017-10-20 DIAGNOSIS — Z79899 Other long term (current) drug therapy: Secondary | ICD-10-CM | POA: Diagnosis not present

## 2018-03-24 ENCOUNTER — Other Ambulatory Visit: Payer: Self-pay | Admitting: Dermatology

## 2018-03-24 DIAGNOSIS — L821 Other seborrheic keratosis: Secondary | ICD-10-CM | POA: Diagnosis not present

## 2018-03-24 DIAGNOSIS — D229 Melanocytic nevi, unspecified: Secondary | ICD-10-CM | POA: Diagnosis not present

## 2018-03-24 DIAGNOSIS — D485 Neoplasm of uncertain behavior of skin: Secondary | ICD-10-CM | POA: Diagnosis not present

## 2018-03-24 DIAGNOSIS — L570 Actinic keratosis: Secondary | ICD-10-CM | POA: Diagnosis not present

## 2018-04-01 DIAGNOSIS — R05 Cough: Secondary | ICD-10-CM | POA: Diagnosis not present

## 2018-04-01 DIAGNOSIS — I10 Essential (primary) hypertension: Secondary | ICD-10-CM | POA: Diagnosis not present

## 2018-10-27 DIAGNOSIS — K219 Gastro-esophageal reflux disease without esophagitis: Secondary | ICD-10-CM | POA: Diagnosis not present

## 2018-10-27 DIAGNOSIS — I10 Essential (primary) hypertension: Secondary | ICD-10-CM | POA: Diagnosis not present

## 2018-10-27 DIAGNOSIS — M7731 Calcaneal spur, right foot: Secondary | ICD-10-CM | POA: Diagnosis not present

## 2019-04-08 ENCOUNTER — Ambulatory Visit (INDEPENDENT_AMBULATORY_CARE_PROVIDER_SITE_OTHER): Payer: PPO | Admitting: Physician Assistant

## 2019-04-08 ENCOUNTER — Encounter: Payer: Self-pay | Admitting: Physician Assistant

## 2019-04-08 ENCOUNTER — Other Ambulatory Visit: Payer: Self-pay

## 2019-04-08 VITALS — BP 154/82 | HR 62 | Temp 96.8°F | Ht 69.0 in | Wt 195.6 lb

## 2019-04-08 DIAGNOSIS — Z0000 Encounter for general adult medical examination without abnormal findings: Secondary | ICD-10-CM

## 2019-04-08 DIAGNOSIS — M7661 Achilles tendinitis, right leg: Secondary | ICD-10-CM | POA: Diagnosis not present

## 2019-04-08 DIAGNOSIS — K219 Gastro-esophageal reflux disease without esophagitis: Secondary | ICD-10-CM | POA: Diagnosis not present

## 2019-04-08 MED ORDER — PREDNISONE 10 MG (48) PO TBPK: ORAL_TABLET | ORAL | 0 refills | Status: DC

## 2019-04-08 MED ORDER — OMEPRAZOLE 20 MG PO CPDR: 20.0000 mg | DELAYED_RELEASE_CAPSULE | Freq: Every day | ORAL | 3 refills | Status: DC

## 2019-04-08 NOTE — Patient Instructions (Signed)
Achilles Tendinitis Rehab Ask your health care provider which exercises are safe for you. Do exercises exactly as told by your health care provider and adjust them as directed. It is normal to feel mild stretching, pulling, tightness, or discomfort as you do these exercises. Stop right away if you feel sudden pain or your pain gets worse. Do not begin these exercises until told by your health care provider. Stretching and range-of-motion exercises These exercises warm up your muscles and joints and improve the movement and flexibility of your ankle. These exercises also help to relieve pain. Standing wall calf stretch with straight knee  1. Stand with your hands against a wall. 2. Extend your left / right leg behind you, and bend your front knee slightly. Keep both of your heels on the floor. 3. Point the toes of your back foot slightly inward. 4. Keeping your heels on the floor and your back knee straight, shift your weight toward the wall. Do not allow your back to arch. You should feel a gentle stretch in your upper calf. 5. Hold this position for __________ seconds. Repeat __________ times. Complete this exercise __________ times a day. Standing wall calf stretch with bent knee 1. Stand with your hands against a wall. 2. Extend your left / right leg behind you, and bend your front knee slightly. Keep both of your heels on the floor. 3. Point the toes of your back foot slightly inward. 4. Keeping your heels on the floor, bend your back knee slightly. You should feel a gentle stretch deep in your lower calf near your heel. 5. Hold this position for __________ seconds. Repeat __________ times. Complete this exercise __________ times a day. Strengthening exercises These exercises build strength and control of your ankle. Endurance is the ability to use your muscles for a long time, even after they get tired. Plantar flexion with band In this exercise, you push your toes downward, away from you,  with an exercise band providing resistance. 1. Sit on the floor with your left / right leg extended. You may put a pillow under your calf to give your foot more room to move. 2. Loop a rubber exercise band or tube around the ball of your left / right foot. The ball of your foot is on the walking surface, right under your toes. The band or tube should be slightly tense when your foot is relaxed. If the band or tube slips, you can put on your shoe or put a washcloth between the band and your foot to help it stay in place. 3. Slowly point your toes downward, pushing them away from you (plantar flexion). 4. Hold this position for __________ seconds. 5. Slowly release the tension in the band or tube, controlling smoothly until your foot is back to the starting position. 6. Repeat steps 1-5 with your left / right leg. Repeat __________ times. Complete this exercise __________ times a day. Eccentric heel drop  In this exercise, you stand and slowly raise your heel and then slowly lower it. This exercise lengthens the calf muscles (eccentric) while the heel bears weight. If this exercise is too easy, try doing it while wearing a backpack with weights in it. 1. Stand on a step with the balls of your feet. The ball of your foot is on the walking surface, right under your toes. ? Do not put your heels on the step. ? For balance, rest your hands on the wall or on a railing. 2. Rise up onto   the balls of your feet. 3. Keeping your heels up, shift all of your weight to your left / right leg and pick up your other leg. 4. Slowly lower your left / right leg so your heel drops below the level of the step. 5. Put down your other foot before returning to the start position. If told by your health care provider, build up to: ? 3 sets of 15 repetitions while keeping your knees straight. ? 3 sets of 15 repetitions while keeping your knees slightly bent as far as told by your health care provider. Repeat __________  times. Complete this exercise __________ times a day. Balance exercises These exercises improve or maintain your balance. Balance is important in preventing falls. Single leg stand If this exercise is too easy, you can try it with your eyes closed or while standing on a pillow. 1. Without shoes, stand near a railing or in a door frame. Hold on to the railing or door frame as needed. 2. Stand on your left / right foot. Keep your big toe down on the floor and try to keep your arch lifted. 3. Hold this position for __________ seconds. Repeat __________ times. Complete this exercise __________ times a day. This information is not intended to replace advice given to you by your health care provider. Make sure you discuss any questions you have with your health care provider. Document Released: 12/11/2004 Document Revised: 08/30/2018 Document Reviewed: 02/22/2018 Elsevier Patient Education  2020 [employer]  

## 2019-04-09 LAB — LIPID PANEL
Chol/HDL Ratio: 3.4 ratio (ref 0.0–5.0)
Cholesterol, Total: 168 mg/dL (ref 100–199)
HDL: 50 mg/dL (ref >39)
LDL Chol Calc (NIH): 101 mg/dL — ABNORMAL HIGH (ref 0–99)
Triglycerides: 93 mg/dL (ref 0–149)
VLDL Cholesterol Cal: 17 mg/dL (ref 5–40)

## 2019-04-09 LAB — CBC WITH DIFFERENTIAL/PLATELET
Basophils Absolute: 0 10*3/uL (ref 0.0–0.2)
Basos: 1 %
EOS (ABSOLUTE): 0.1 10*3/uL (ref 0.0–0.4)
Eos: 2 %
Hematocrit: 45.3 % (ref 37.5–51.0)
Hemoglobin: 15.5 g/dL (ref 13.0–17.7)
Immature Grans (Abs): 0 10*3/uL (ref 0.0–0.1)
Immature Granulocytes: 0 %
Lymphocytes Absolute: 2 10*3/uL (ref 0.7–3.1)
Lymphs: 35 %
MCH: 29.7 pg (ref 26.6–33.0)
MCHC: 34.2 g/dL (ref 31.5–35.7)
MCV: 87 fL (ref 79–97)
Monocytes Absolute: 0.5 10*3/uL (ref 0.1–0.9)
Monocytes: 8 %
Neutrophils Absolute: 3.1 10*3/uL (ref 1.4–7.0)
Neutrophils: 54 %
Platelets: 198 10*3/uL (ref 150–450)
RBC: 5.22 x10E6/uL (ref 4.14–5.80)
RDW: 12.7 % (ref 11.6–15.4)
WBC: 5.8 10*3/uL (ref 3.4–10.8)

## 2019-04-09 LAB — CMP14+EGFR
ALT: 20 IU/L (ref 0–44)
AST: 19 IU/L (ref 0–40)
Albumin/Globulin Ratio: 2.7 — ABNORMAL HIGH (ref 1.2–2.2)
Albumin: 4.3 g/dL (ref 3.7–4.7)
Alkaline Phosphatase: 68 IU/L (ref 39–117)
BUN/Creatinine Ratio: 17 (ref 10–24)
BUN: 17 mg/dL (ref 8–27)
Bilirubin Total: 0.7 mg/dL (ref 0.0–1.2)
CO2: 23 mmol/L (ref 20–29)
Calcium: 9.1 mg/dL (ref 8.6–10.2)
Chloride: 104 mmol/L (ref 96–106)
Creatinine, Ser: 0.99 mg/dL (ref 0.76–1.27)
GFR calc Af Amer: 87 mL/min/{1.73_m2} (ref >59)
GFR calc non Af Amer: 75 mL/min/{1.73_m2} (ref >59)
Globulin, Total: 1.6 g/dL (ref 1.5–4.5)
Glucose: 91 mg/dL (ref 65–99)
Potassium: 4.5 mmol/L (ref 3.5–5.2)
Sodium: 142 mmol/L (ref 134–144)
Total Protein: 5.9 g/dL — ABNORMAL LOW (ref 6.0–8.5)

## 2019-04-09 LAB — TSH: TSH: 1.18 u[IU]/mL (ref 0.450–4.500)

## 2019-04-09 LAB — PSA: Prostate Specific Ag, Serum: <0.1 ng/mL (ref 0.0–4.0)

## 2019-04-15 NOTE — Progress Notes (Signed)
BP (!) 154/82   Pulse 62   Temp (!) 96.8 F (36 C) (Temporal)   Ht '5\' 9"'  (1.753 m)   Wt 195 lb 9.6 oz (88.7 kg)   SpO2 98%   BMI 28.89 kg/m    Subjective:    Patient ID: Dwayne [name], male    DOB: Dec 01, 1945, 73 y.o.   MRN: [id number]  HPI: Dwayne [name] is a 73 y.o. male presenting on 04/08/2019 for New Patient (Initial Visit)  This patient comes in to be established.  He is a longtime patient of Dr.Nyland.  He does have GERD, history of prostate cancer, history of skin cancer, chronic kidney disease.  He is also having a significant difficulty with his right Achilles.  It hurts whenever he gets up in the morning and it pulls and is tight.  He has tried to work on stretching around taking medication.  We have talked about physical therapy and we will add some prednisone to see if this can calm it down.  He will let us know if it does not improve.  He does get followed by his urologist for his PSAs.  Past Medical History:  Diagnosis Date  . Cancer of prostate ([place])   . Cancer of skin   . Chronic kidney disease    prostate cancer   . GERD (gastroesophageal reflux disease)   . PONV (postoperative nausea and vomiting)    Relevant past medical, surgical, family and social history reviewed and updated as indicated. Interim medical history since our last visit reviewed. Allergies and medications reviewed and updated. DATA REVIEWED: CHART IN EPIC  Family History reviewed for pertinent findings.  Review of Systems  Constitutional: Negative.  Negative for appetite change and fatigue.  Eyes: Negative for pain and visual disturbance.  Respiratory: Negative.  Negative for cough, chest tightness, shortness of breath and wheezing.   Cardiovascular: Negative.  Negative for chest pain, palpitations and leg swelling.  Genitourinary: Negative.   Musculoskeletal: Positive for arthralgias.  Skin: Negative.  Negative for color change and rash.  Neurological: Negative.  Negative for  weakness, numbness and headaches.  Psychiatric/Behavioral: Negative.     Allergies as of 04/08/2019   No Known Allergies     Medication List       Accurate as of April 08, 2019 11:59 PM. If you have any questions, ask your nurse or doctor.        aspirin EC 81 MG tablet Take 81 mg by mouth daily.   multivitamin tablet Take 1 tablet by mouth daily.   omeprazole 20 MG capsule Commonly known as: PRILOSEC Take 1 capsule (20 mg total) by mouth daily.   predniSONE 10 MG (48) Tbpk tablet Commonly known as: STERAPRED UNI-PAK 48 TAB Take as directed for 12 days Started by: Terald Sleeper, PA-C          Objective:    BP (!) 154/82   Pulse 62   Temp (!) 96.8 F (36 C) (Temporal)   Ht '5\' 9"'  (1.753 m)   Wt 195 lb 9.6 oz (88.7 kg)   SpO2 98%   BMI 28.89 kg/m   No Known Allergies  Wt Readings from Last 3 Encounters:  04/08/19 195 lb 9.6 oz (88.7 kg)  07/16/11 200 lb (90.7 kg)  07/09/11 200 lb 12.8 oz (91.1 kg)    Physical Exam Vitals signs and nursing note reviewed.  Constitutional:      General: He is not in acute distress.  Appearance: He is well-developed.  HENT:     Head: Normocephalic and atraumatic.  Eyes:     Conjunctiva/sclera: Conjunctivae normal.     Pupils: Pupils are equal, round, and reactive to light.  Cardiovascular:     Rate and Rhythm: Normal rate and regular rhythm.     Heart sounds: Normal heart sounds.  Pulmonary:     Effort: Pulmonary effort is normal. No respiratory distress.     Breath sounds: Normal breath sounds.  Musculoskeletal:     Right foot: Decreased range of motion. Tenderness present. No crepitus.       Feet:  Skin:    General: Skin is warm and dry.  Psychiatric:        Behavior: Behavior normal.     Results for orders placed or performed in visit on 04/08/19  CBC with Differential/Platelet  Result Value Ref Range   WBC 5.8 3.4 - 10.8 x10E3/uL   RBC 5.22 4.14 - 5.80 x10E6/uL   Hemoglobin 15.5 13.0 - 17.7 g/dL    Hematocrit 45.3 37.5 - 51.0 %   MCV 87 79 - 97 fL   MCH 29.7 26.6 - 33.0 pg   MCHC 34.2 31.5 - 35.7 g/dL   RDW 12.7 11.6 - 15.4 %   Platelets 198 150 - 450 x10E3/uL   Neutrophils 54 Not Estab. %   Lymphs 35 Not Estab. %   Monocytes 8 Not Estab. %   Eos 2 Not Estab. %   Basos 1 Not Estab. %   Neutrophils Absolute 3.1 1.4 - 7.0 x10E3/uL   Lymphocytes Absolute 2.0 0.7 - 3.1 x10E3/uL   Monocytes Absolute 0.5 0.1 - 0.9 x10E3/uL   EOS (ABSOLUTE) 0.1 0.0 - 0.4 x10E3/uL   Basophils Absolute 0.0 0.0 - 0.2 x10E3/uL   Immature Granulocytes 0 Not Estab. %   Immature Grans (Abs) 0.0 0.0 - 0.1 x10E3/uL  CMP14+EGFR  Result Value Ref Range   Glucose 91 65 - 99 mg/dL   BUN 17 8 - 27 mg/dL   Creatinine, Ser 0.99 0.76 - 1.27 mg/dL   GFR calc non Af Amer 75 >59 mL/min/1.73   GFR calc Af Amer 87 >59 mL/min/1.73   BUN/Creatinine Ratio 17 10 - 24   Sodium 142 134 - 144 mmol/L   Potassium 4.5 3.5 - 5.2 mmol/L   Chloride 104 96 - 106 mmol/L   CO2 23 20 - 29 mmol/L   Calcium 9.1 8.6 - 10.2 mg/dL   Total Protein 5.9 (L) 6.0 - 8.5 g/dL   Albumin 4.3 3.7 - 4.7 g/dL   Globulin, Total 1.6 1.5 - 4.5 g/dL   Albumin/Globulin Ratio 2.7 (H) 1.2 - 2.2   Bilirubin Total 0.7 0.0 - 1.2 mg/dL   Alkaline Phosphatase 68 39 - 117 IU/L   AST 19 0 - 40 IU/L   ALT 20 0 - 44 IU/L  Lipid Panel  Result Value Ref Range   Cholesterol, Total 168 100 - 199 mg/dL   Triglycerides 93 0 - 149 mg/dL   HDL 50 >39 mg/dL   VLDL Cholesterol Cal 17 5 - 40 mg/dL   LDL Chol Calc (NIH) 101 (H) 0 - 99 mg/dL   Chol/HDL Ratio 3.4 0.0 - 5.0 ratio  TSH  Result Value Ref Range   TSH 1.180 0.450 - 4.500 uIU/mL  PSA  Result Value Ref Range   Prostate Specific Ag, Serum <0.1 0.0 - 4.0 ng/mL      Assessment & Plan:   1. Achilles tendinitis of  right lower extremity - predniSONE (STERAPRED UNI-PAK 48 TAB) 10 MG (48) TBPK tablet; Take as directed for 12 days  Dispense: 48 tablet; Refill: 0  2. Well adult exam - CBC with  Differential/Platelet - CMP14+EGFR - Lipid Panel - TSH - PSA  3. Gastroesophageal reflux disease without esophagitis - omeprazole (PRILOSEC) 20 MG capsule; Take 1 capsule (20 mg total) by mouth daily.  Dispense: 90 capsule; Refill: 3   Continue all other maintenance medications as listed above.  Follow up plan: No follow-ups on file.  Educational handout given for [place] PA-C [facility] [address]  [place], [place] [address] [phone]   04/15/2019, 5:33 PM

## 2019-06-28 ENCOUNTER — Ambulatory Visit (INDEPENDENT_AMBULATORY_CARE_PROVIDER_SITE_OTHER): Payer: PPO | Admitting: Family Medicine

## 2019-06-28 ENCOUNTER — Encounter: Payer: Self-pay | Admitting: Family Medicine

## 2019-06-28 ENCOUNTER — Other Ambulatory Visit: Payer: Self-pay

## 2019-06-28 VITALS — BP 160/78 | HR 66 | Temp 97.5°F | Ht 69.0 in | Wt 199.0 lb

## 2019-06-28 DIAGNOSIS — R030 Elevated blood-pressure reading, without diagnosis of hypertension: Secondary | ICD-10-CM

## 2019-06-28 DIAGNOSIS — K1230 Oral mucositis (ulcerative), unspecified: Secondary | ICD-10-CM

## 2019-06-28 MED ORDER — MAGIC MOUTHWASH W/LIDOCAINE: 5.0000 mL | Freq: Four times a day (QID) | ORAL | 0 refills | Status: DC | PRN

## 2019-06-28 MED ORDER — TRIAMCINOLONE ACETONIDE 0.1 % MT PSTE: 1.0000 "application " | PASTE | Freq: Two times a day (BID) | OROMUCOSAL | 0 refills | Status: DC

## 2019-06-28 NOTE — Progress Notes (Signed)
Assessment & Plan:  1. Oral mucositis - Education provided on oral mucositis.  - triamcinolone (KENALOG) 0.1 % paste; Use as directed 1 application in the mouth or throat 2 (two) times daily.  Dispense: 5 g; Refill: 0 - magic mouthwash w/lidocaine SOLN; Take 5 mLs by mouth 4 (four) times daily as needed for mouth pain.  Dispense: 30 mL; Refill: 0  2. Elevated BP without diagnosis of hypertension - Encouraged patient to monitor BP at home periodically over the next few weeks and keep a log. He is to bring this log to his f/u appointment to assess need for BP medication. Goal < 150/90.    Follow up plan: Return in about 4 weeks (around 07/26/2019) for f/u BP with PCP .  Hendricks Limes, MSN, APRN, FNP-C [facility]  Subjective:   Patient ID: Dwayne [name], male    DOB: 1945/09/23, 74 y.o.   MRN: [id number]  HPI: Dwayne [name] is a 74 y.o. male presenting on 06/28/2019 for sore in mouth (x 5 days)  Patient has a sore on the right upper gum that started 5 days ago. Patient has experienced this before and reports it typically resolves after two days but this time it hasn't. Has been swishing with peroxide and mouthwash to treat at home. It does cause pain when eating.   ROS: Negative unless specifically indicated above in HPI.   Relevant past medical history reviewed and updated as indicated.   Allergies and medications reviewed and updated.   Current Outpatient Medications:  .  aspirin EC 81 MG tablet, Take 81 mg by mouth daily., Disp: , Rfl:  .  Multiple Vitamin (MULTIVITAMIN) tablet, Take 1 tablet by mouth daily., Disp: , Rfl:  .  omeprazole (PRILOSEC) 20 MG capsule, Take 1 capsule (20 mg total) by mouth daily., Disp: 90 capsule, Rfl: 3 .  magic mouthwash w/lidocaine SOLN, Take 5 mLs by mouth 4 (four) times daily as needed for mouth pain., Disp: 30 mL, Rfl: 0 .  triamcinolone (KENALOG) 0.1 % paste, Use as directed 1 application in the mouth or throat 2 (two)  times daily., Disp: 5 g, Rfl: 0  No Known Allergies  Objective:   BP (!) 160/78 Comment: manual  Pulse 66   Temp (!) 97.5 F (36.4 C) (Temporal)   Ht 5\' 9"  (1.753 m)   Wt 199 lb (90.3 kg)   SpO2 97%   BMI 29.39 kg/m    Physical Exam Vitals reviewed.  Constitutional:      General: He is not in acute distress.    Appearance: Normal appearance. He is not ill-appearing, toxic-appearing or diaphoretic.  HENT:     Head: Normocephalic and atraumatic.     Mouth/Throat:     Palate: Lesions (irregular shape with erythematous border to the right side of the roof of his mouth) present.  Eyes:     General: No scleral icterus.       Right eye: No discharge.        Left eye: No discharge.     Conjunctiva/sclera: Conjunctivae normal.  Cardiovascular:     Rate and Rhythm: Normal rate.  Pulmonary:     Effort: Pulmonary effort is normal. No respiratory distress.  Musculoskeletal:        General: Normal range of motion.     Cervical back: Normal range of motion.  Skin:    General: Skin is warm and dry.  Neurological:     Mental Status: He is alert  and oriented to person, place, and time. Mental status is at baseline.  Psychiatric:        Mood and Affect: Mood normal.        Behavior: Behavior normal.        Thought Content: Thought content normal.        Judgment: Judgment normal.

## 2019-06-28 NOTE — Patient Instructions (Signed)
Oral Mucositis Oral mucositis is a mouth condition that may develop as a result of treatments for cancer. Sores may appear on the lips, gums, tongue, throat, and the top (roof) or bottom (floor) of the mouth. What are the causes? This condition is caused when cancer treatments damage the lining of the mouth. This condition can happen to anyone who is being treated with cancer therapies, including:  Cancer medicines (chemotherapy) or targeted therapy.  Radiation therapy.  Bone marrow transplants and stem cell transplants. Oral mucositis is not caused by infection. However, the sores can become infected after they form. Infection can make oral mucositis worse. What increases the risk? The following factors may make you more likely to develop this condition:  Having cancers that primarily affect the blood, head, or neck.  Receiving radiation therapy alone, or in combination with chemotherapy, to the head and neck region.  Undergoing high dose chemotherapy alone or as part of bone marrow or stem cell transplant. In addition, there are other risks, including:  Having poor oral hygiene, dental problems or oral diseases.  Wearing dentures that do not fit correctly.  Having other medical conditions, such as diabetes, HIV, AIDS, or kidney disease.  Using products that contain nicotine or tobacco, such as cigarettes, chewing tobacco, and e-cigarettes.  Not drinking enough clear fluids.  Drinking alcohol. What are the signs or symptoms? Symptoms of this condition include:  Mouth sores. These sores may bleed.  Color changes inside the mouth. Red, shiny areas may appear.  White patches or pus in the mouth.  Pain in the mouth and throat. This can make it painful to speak and swallow.  Dryness and a burning feeling in the mouth.  Saliva that is thick.  Trouble eating, drinking, and swallowing. This can lead to weight loss. Symptoms of this condition can vary from mild to severe.  Symptoms are usually seen 7-10 days after cancer treatment has started. How is this diagnosed? This condition can be diagnosed with a physical exam. In some cases, lab tests or cultures may be done to check for an associated infection. How is this treated? Treatment depends on the severity of the condition. Oral mucositis often heals on its own. Sometimes, changes in the cancer treatment can help. Treatment may include medicines or therapies, such as:  An antibiotic medicine to fight infection.  Medicine or therapies that help the cells in your mouth heal more quickly.  Chewing on ice before treatment to help prevent mucositis. Medicine may also be given to help control pain. This may include:  Pain relievers that are swished around in the mouth (topical anesthetics). These make the mouth numb to ease the pain.  Mouth rinses.  Prescribed, medicated gels. The gel coats the mouth. This protects nerve endings and lessens the pain.  Pain medicines. Follow these instructions at home: Medicines  Take or apply over-the-counter and prescription medicines only as told by your health care provider.  If you were prescribed an antibiotic medicine, take or apply it as told by your health care provider. Do not stop using the antibiotic even if you start to feel better.  Do not use products that contain benzocaine, including numbing gels, to treat mouth pain in children who are younger than 2 years. These products may cause a rare but serious blood condition. Eating and drinking   Talk to a diet and nutrition specialist (dietitian) about what you should eat and drink if you have mucositis.  Drink high-nutrition and high-calorie shakes or supplements.    Eat bland and soft foods that are easy to eat.  Drink enough fluid to keep your urine pale yellow.  Do not eat foods that are hot, spicy, citrus, or hard to swallow.  Do not drink alcohol.  Try sucking on ice chips or sugar-free frozen pops.  This may help with pain. This also keeps your mouth moist. Lifestyle      Keep your mouth clean and germ-free. To maintain good oral hygiene: ? Brush your teeth carefully with a soft toothbrush at least two times each day. Use a gentle toothpaste. Ask your health care provider to recommend the right toothpaste for you. ? Use a soft sponge (oral swab) to clean your mouth and teeth instead of a toothbrush if mouth sores are severe. ? Floss your teeth every day. ? Have your teeth cleaned regularly as recommended by your dentist. ? Rinse your mouth after every meal or as directed by your health care provider. Do not use mouthwash that contains alcohol. Ask your health care provider for a mouthwash or mouth rinse recommendation.  Do not use any products that contain nicotine or tobacco, such as cigarettes, e-cigarettes, and chewing tobacco. If you need help quitting, ask your health care provider. General instructions  Follow instructions from your health care provider about: ? Cleaning mouth sores. ? Taking out your dentures. ? Changing your diet or finding other ways to get nutrients. This is important if you are losing weight.  If your lips are dry or cracked, apply a water-based moisturizer to your lips as needed.  Keep all follow-up visits as told by your health care provider. This is important. Contact a health care provider if:  You have mouth pain or throat pain.  Your symptoms get worse.  You have new symptoms.  Your pain is not controlled with medicine.  You have trouble speaking. Get help right away if you:  Have a fever.  Cannot swallow solid food or liquids.  Have a lot of bleeding in your mouth.  Develop new, open, or draining sores in your mouth. Summary  Oral mucositis is a mouth condition that may develop as a result of treatments for cancer.  Sores may appear on your lips, gums, tongue, throat, and the top (roof) or bottom (floor) of your mouth.  Cancer  treatments can damage the lining of the mouth, which causes this condition.  Treatment depends on how severe the condition is. It may include medicine or therapies to fight infection, medicine to ease pain, or medicine to help the cells in your mouth heal more quickly. This information is not intended to replace advice given to you by your health care provider. Make sure you discuss any questions you have with your health care provider. Document Revised: 12/20/2018 Document Reviewed: 12/20/2018 Elsevier Patient Education  2020 [employer]  

## 2019-06-29 ENCOUNTER — Ambulatory Visit: Payer: PPO | Admitting: Family Medicine

## 2019-07-25 ENCOUNTER — Other Ambulatory Visit: Payer: Self-pay

## 2019-07-26 ENCOUNTER — Ambulatory Visit (INDEPENDENT_AMBULATORY_CARE_PROVIDER_SITE_OTHER): Payer: PPO | Admitting: Physician Assistant

## 2019-07-26 ENCOUNTER — Encounter: Payer: Self-pay | Admitting: Physician Assistant

## 2019-07-26 VITALS — BP 150/83 | HR 78 | Temp 96.6°F | Ht 69.0 in | Wt 199.6 lb

## 2019-07-26 DIAGNOSIS — R030 Elevated blood-pressure reading, without diagnosis of hypertension: Secondary | ICD-10-CM | POA: Diagnosis not present

## 2019-07-26 DIAGNOSIS — I10 Essential (primary) hypertension: Secondary | ICD-10-CM | POA: Insufficient documentation

## 2019-07-26 NOTE — Progress Notes (Signed)
BP (!) 150/83   Pulse 78   Temp (!) 96.6 F (35.9 C)   Ht '5\' 9"'  (1.753 m)   Wt 199 lb 9.6 oz (90.5 kg)   SpO2 97%   BMI 29.48 kg/m    Subjective:    Patient ID: Dwayne [name], male    DOB: [birth year], 74 y.o.   MRN: [id number]  HPI There are no diagnoses linked to this encounter.  HPI: Dwayne [name] is a 74 y.o. male presenting on 07/26/2019 for Follow-up and Hypertension  Previously the patient's blood pressure was 014 up to 103 systolically and 01-31 diastolically.  He has had a great improvement today been 150/83 or under. In addition the patient brought his blood pressure record reading over the past month and all of the readings have been 438O and under systolically and 87N diastolically.  He denies any chest pain shortness of breath.  No weakness or dizziness.  He occasionally gets a headache.  He notes this is related to a head injury he had some time ago.  He denies having any other issues at this time  We have also discussed his well labs.  All were normal and he can recheck this in 1 year.  Past Medical History:  Diagnosis Date  . Cancer of prostate ([place])   . Cancer of skin   . Chronic kidney disease    prostate cancer   . GERD (gastroesophageal reflux disease)   . PONV (postoperative nausea and vomiting)    Relevant past medical, surgical, family and social history reviewed and updated as indicated. Interim medical history since our last visit reviewed. Allergies and medications reviewed and updated. DATA REVIEWED: CHART IN EPIC  Family History reviewed for pertinent findings.  Review of Systems  Constitutional: Negative.  Negative for appetite change and fatigue.  HENT: Negative.   Eyes: Negative.  Negative for pain and visual disturbance.  Respiratory: Negative.  Negative for cough, chest tightness, shortness of breath and wheezing.   Cardiovascular: Negative.  Negative for chest pain, palpitations and leg swelling.  Gastrointestinal: Negative.   Negative for abdominal pain, diarrhea, nausea and vomiting.  Endocrine: Negative.   Genitourinary: Negative.   Musculoskeletal: Negative.   Skin: Negative.  Negative for color change and rash.  Neurological: Positive for headaches. Negative for weakness and numbness.  Psychiatric/Behavioral: Negative.     Allergies as of 07/26/2019   No Known Allergies     Medication List       Accurate as of July 26, 2019  8:37 AM. If you have any questions, ask your nurse or doctor.        aspirin EC 81 MG tablet Take 81 mg by mouth daily.   magic mouthwash w/lidocaine Soln Take 5 mLs by mouth 4 (four) times daily as needed for mouth pain.   multivitamin tablet Take 1 tablet by mouth daily.   omeprazole 20 MG capsule Commonly known as: PRILOSEC Take 1 capsule (20 mg total) by mouth daily.   triamcinolone 0.1 % paste Commonly known as: KENALOG Use as directed 1 application in the mouth or throat 2 (two) times daily.          Objective:    BP (!) 150/83   Pulse 78   Temp (!) 96.6 F (35.9 C)   Ht '5\' 9"'  (1.753 m)   Wt 199 lb 9.6 oz (90.5 kg)   SpO2 97%   BMI 29.48 kg/m   No Known Allergies  Wt Readings  from Last 3 Encounters:  07/26/19 199 lb 9.6 oz (90.5 kg)  06/28/19 199 lb (90.3 kg)  04/08/19 195 lb 9.6 oz (88.7 kg)    Physical Exam Vitals and nursing note reviewed.  Constitutional:      General: He is not in acute distress.    Appearance: He is well-developed.  HENT:     Head: Normocephalic and atraumatic.  Eyes:     Conjunctiva/sclera: Conjunctivae normal.     Pupils: Pupils are equal, round, and reactive to light.  Cardiovascular:     Rate and Rhythm: Normal rate and regular rhythm.     Heart sounds: Normal heart sounds.  Pulmonary:     Effort: Pulmonary effort is normal. No respiratory distress.     Breath sounds: Normal breath sounds.  Skin:    General: Skin is warm and dry.  Psychiatric:        Behavior: Behavior normal.     Results for orders  placed or performed in visit on 04/08/19  CBC with Differential/Platelet  Result Value Ref Range   WBC 5.8 3.4 - 10.8 x10E3/uL   RBC 5.22 4.14 - 5.80 x10E6/uL   Hemoglobin 15.5 13.0 - 17.7 g/dL   Hematocrit 45.3 37.5 - 51.0 %   MCV 87 79 - 97 fL   MCH 29.7 26.6 - 33.0 pg   MCHC 34.2 31.5 - 35.7 g/dL   RDW 12.7 11.6 - 15.4 %   Platelets 198 150 - 450 x10E3/uL   Neutrophils 54 Not Estab. %   Lymphs 35 Not Estab. %   Monocytes 8 Not Estab. %   Eos 2 Not Estab. %   Basos 1 Not Estab. %   Neutrophils Absolute 3.1 1.4 - 7.0 x10E3/uL   Lymphocytes Absolute 2.0 0.7 - 3.1 x10E3/uL   Monocytes Absolute 0.5 0.1 - 0.9 x10E3/uL   EOS (ABSOLUTE) 0.1 0.0 - 0.4 x10E3/uL   Basophils Absolute 0.0 0.0 - 0.2 x10E3/uL   Immature Granulocytes 0 Not Estab. %   Immature Grans (Abs) 0.0 0.0 - 0.1 x10E3/uL  CMP14+EGFR  Result Value Ref Range   Glucose 91 65 - 99 mg/dL   BUN 17 8 - 27 mg/dL   Creatinine, Ser 0.99 0.76 - 1.27 mg/dL   GFR calc non Af Amer 75 >59 mL/min/1.73   GFR calc Af Amer 87 >59 mL/min/1.73   BUN/Creatinine Ratio 17 10 - 24   Sodium 142 134 - 144 mmol/L   Potassium 4.5 3.5 - 5.2 mmol/L   Chloride 104 96 - 106 mmol/L   CO2 23 20 - 29 mmol/L   Calcium 9.1 8.6 - 10.2 mg/dL   Total Protein 5.9 (L) 6.0 - 8.5 g/dL   Albumin 4.3 3.7 - 4.7 g/dL   Globulin, Total 1.6 1.5 - 4.5 g/dL   Albumin/Globulin Ratio 2.7 (H) 1.2 - 2.2   Bilirubin Total 0.7 0.0 - 1.2 mg/dL   Alkaline Phosphatase 68 39 - 117 IU/L   AST 19 0 - 40 IU/L   ALT 20 0 - 44 IU/L  Lipid Panel  Result Value Ref Range   Cholesterol, Total 168 100 - 199 mg/dL   Triglycerides 93 0 - 149 mg/dL   HDL 50 >39 mg/dL   VLDL Cholesterol Cal 17 5 - 40 mg/dL   LDL Chol Calc (NIH) 101 (H) 0 - 99 mg/dL   Chol/HDL Ratio 3.4 0.0 - 5.0 ratio  TSH  Result Value Ref Range   TSH 1.180 0.450 - 4.500 uIU/mL  PSA  Result Value Ref Range   Prostate Specific Ag, Serum <0.1 0.0 - 4.0 ng/mL      Assessment & Plan:   1. Elevated BP  without diagnosis of hypertension Monitor at least once weekly Return if any elevations   Continue all other maintenance medications as listed above.  Follow up plan: No follow-ups on file.  Educational handout given for [place] PA-C [facility] [address]  [place], [place] [address] [phone]   07/26/2019, 8:37 AM

## 2020-05-07 ENCOUNTER — Other Ambulatory Visit: Payer: Self-pay | Admitting: *Deleted

## 2020-05-07 DIAGNOSIS — K219 Gastro-esophageal reflux disease without esophagitis: Secondary | ICD-10-CM

## 2020-05-07 MED ORDER — OMEPRAZOLE 20 MG PO CPDR: 20.0000 mg | DELAYED_RELEASE_CAPSULE | Freq: Every day | ORAL | 0 refills | Status: DC

## 2020-05-22 ENCOUNTER — Ambulatory Visit (INDEPENDENT_AMBULATORY_CARE_PROVIDER_SITE_OTHER): Payer: PPO | Admitting: Nurse Practitioner

## 2020-05-22 ENCOUNTER — Other Ambulatory Visit: Payer: Self-pay

## 2020-05-22 ENCOUNTER — Encounter: Payer: Self-pay | Admitting: Nurse Practitioner

## 2020-05-22 VITALS — BP 169/88 | HR 83 | Temp 98.2°F | Ht 69.0 in | Wt 199.4 lb

## 2020-05-22 DIAGNOSIS — K219 Gastro-esophageal reflux disease without esophagitis: Secondary | ICD-10-CM | POA: Diagnosis not present

## 2020-05-22 DIAGNOSIS — R030 Elevated blood-pressure reading, without diagnosis of hypertension: Secondary | ICD-10-CM | POA: Diagnosis not present

## 2020-05-22 MED ORDER — OMEPRAZOLE 20 MG PO CPDR: 20.0000 mg | DELAYED_RELEASE_CAPSULE | Freq: Every day | ORAL | 2 refills | Status: DC

## 2020-05-22 NOTE — Progress Notes (Signed)
Established Patient Office Visit  Subjective:  Patient ID: Dwayne [name], male    DOB: [birth year]  Age: 74 y.o. MRN: [id number]  CC:  Chief Complaint  Patient presents with  . Medical Management of Chronic Issues    Dwayne [name] patient     HPI Dwayne [name] is a 73 year old male who presents to clinic for elevated blood pressure.  Patient is reporting that his blood pressure is usually not elevated at home.  Patient has never had any blood pressure medication.  Patient denies any headache, dizziness, nausea or vomiting.   GERD, Follow up:  The patient was last seen for GERD few years ago. Changes made since that visit include none.  He reports excellent compliance with treatment. He is not having side effects. Dwayne [name]  He is NOT experiencing any side effects or medications/ abdominal bloating, choking on food, cough, deep pressure at base of neck or difficulty swallowing  -----------------------------------------------------------------------------------------   Past Medical History:  Diagnosis Date  . Cancer of prostate ([facility])   . Cancer of skin   . Chronic kidney disease    prostate cancer   . GERD (gastroesophageal reflux disease)   . PONV (postoperative nausea and vomiting)     Past Surgical History:  Procedure Laterality Date  . CHOLECYSTECTOMY    . OTHER SURGICAL HISTORY     right shoulder surgery   . OTHER SURGICAL HISTORY     right knee arthroscopic surgery   . OTHER SURGICAL HISTORY     neck surgery   . ROBOT ASSISTED LAPAROSCOPIC RADICAL PROSTATECTOMY  07/16/2011   Procedure: ROBOTIC ASSISTED LAPAROSCOPIC RADICAL PROSTATECTOMY;  Surgeon: Valetta Fuller, MD;  Location: [facility];  Service: Urology;  Laterality: N/A;  Robtic Assisted Laproscopic Radical Retropubic Prostatetomy      Family History  Problem Relation Age of Onset  . Heart attack Father   . Cancer Brother   . Leukemia Brother     Social History   Socioeconomic History  . Marital status: Married     Spouse name: Not on file  . Number of children: Not on file  . Years of education: Not on file  . Highest education level: Not on file  Occupational History  . Not on file  Tobacco Use  . Smoking status: Former Games developer  . Smokeless tobacco: Former Clinical biochemist  . Vaping Use: Never used  Substance and Sexual Activity  . Alcohol use: No  . Drug use: No  . Sexual activity: Not on file  Other Topics Concern  . Not on file  Social History Narrative  . Not on file   Social Determinants of Health   Financial Resource Strain: Not on file  Food Insecurity: Not on file  Transportation Needs: Not on file  Physical Activity: Not on file  Stress: Not on file  Social Connections: Not on file  Intimate Partner Violence: Not on file    Outpatient Medications Prior to Visit  Medication Sig Dispense Refill  . aspirin EC 81 MG tablet Take 81 mg by mouth daily.    . Multiple Vitamin (MULTIVITAMIN) tablet Take 1 tablet by mouth daily.    . magic mouthwash w/lidocaine SOLN Take 5 mLs by mouth 4 (four) times daily as needed for mouth pain. 30 mL 0  . omeprazole (PRILOSEC) 20 MG capsule Take 1 capsule (20 mg total) by mouth daily. (Needs to be seen before next refill) 30 capsule 0  . triamcinolone (KENALOG) 0.1 % paste Use  as directed 1 application in the mouth or throat 2 (two) times daily. 5 g 0   No facility-administered medications prior to visit.    No Known Allergies  ROS Review of Systems  Gastrointestinal: Negative for abdominal distention, abdominal pain, constipation, diarrhea and nausea.  All other systems reviewed and are negative.     Objective:    Physical Exam Vitals reviewed.  Constitutional:      Appearance: Normal appearance.  HENT:     Head: Normocephalic.     Mouth/Throat:     Mouth: Mucous membranes are moist.     Pharynx: Oropharynx is clear.  Eyes:     Conjunctiva/sclera: Conjunctivae normal.  Cardiovascular:     Rate and Rhythm: Normal rate and  regular rhythm.     Pulses: Normal pulses.     Heart sounds: Normal heart sounds.  Pulmonary:     Effort: Pulmonary effort is normal.  Abdominal:     General: Bowel sounds are normal.     Tenderness: There is no abdominal tenderness.  Musculoskeletal:        General: No tenderness.  Skin:    General: Skin is warm.  Neurological:     Mental Status: He is alert and oriented to person, place, and time.  Psychiatric:        Mood and Affect: Mood normal.        Behavior: Behavior normal.     BP (!) 169/88   Pulse 83   Temp 98.2 F (36.8 C) (Temporal)   Ht 5\' 9"  (1.753 m)   Wt 199 lb 6.4 oz (90.4 kg)   BMI 29.45 kg/m  Wt Readings from Last 3 Encounters:  05/22/20 199 lb 6.4 oz (90.4 kg)  07/26/19 199 lb 9.6 oz (90.5 kg)  06/28/19 199 lb (90.3 kg)     There are no preventive care reminders to display for this patient.  There are no preventive care reminders to display for this patient.  Lab Results  Component Value Date   TSH 1.180 04/08/2019   Lab Results  Component Value Date   WBC 5.8 04/08/2019   HGB 15.5 04/08/2019   HCT 45.3 04/08/2019   MCV 87 04/08/2019   PLT 198 04/08/2019   Lab Results  Component Value Date   NA 142 04/08/2019   K 4.5 04/08/2019   CO2 23 04/08/2019   GLUCOSE 91 04/08/2019   BUN 17 04/08/2019   CREATININE 0.99 04/08/2019   BILITOT 0.7 04/08/2019   ALKPHOS 68 04/08/2019   AST 19 04/08/2019   ALT 20 04/08/2019   PROT 5.9 (L) 04/08/2019   ALBUMIN 4.3 04/08/2019   CALCIUM 9.1 04/08/2019   Lab Results  Component Value Date   CHOL 168 04/08/2019   Lab Results  Component Value Date   HDL 50 04/08/2019   Lab Results  Component Value Date   LDLCALC 101 (H) 04/08/2019   Lab Results  Component Value Date   TRIG 93 04/08/2019   Lab Results  Component Value Date   CHOLHDL 3.4 04/08/2019   No results found for: HGBA1C    Assessment & Plan:   Problem List Items Addressed This Visit      Digestive   Gastroesophageal  reflux disease without esophagitis    Well-controlled on current medication, omeprazole 20 mg capsule, 1 capsule daily  No changes to current dose.  Continue on a healthy diet and exercise regimen as tolerated.  Rx refill sent to pharmacy.  Relevant Medications   omeprazole (PRILOSEC) 20 MG capsule     Other   Elevated BP without diagnosis of hypertension - Primary    Elevated blood pressure in clinic.  Advised patient to keep blood pressure log for a week as patient's denies seeing blood pressure readings at home. Advised patient to reduce salt, diet and exercise as tolerated.  Recheck blood pressure in 3 months.  Follow-up with worsening or unresolved symptoms.         Meds ordered this encounter  Medications  . omeprazole (PRILOSEC) 20 MG capsule    Sig: Take 1 capsule (20 mg total) by mouth daily. (Needs to be seen before next refill)    Dispense:  30 capsule    Refill:  2    Follow-up: Return if symptoms worsen or fail to improve.    Ivy Lynn, NP

## 2020-05-22 NOTE — Assessment & Plan Note (Signed)
Well-controlled on current medication, omeprazole 20 mg capsule, 1 capsule daily  No changes to current dose.  Continue on a healthy diet and exercise regimen as tolerated.  Rx refill sent to pharmacy.

## 2020-05-22 NOTE — Patient Instructions (Signed)

## 2020-05-22 NOTE — Assessment & Plan Note (Signed)
Elevated blood pressure in clinic.  Advised patient to keep blood pressure log for a week as patient's denies seeing blood pressure readings at home. Advised patient to reduce salt, diet and exercise as tolerated.  Recheck blood pressure in 3 months.  Follow-up with worsening or unresolved symptoms.

## 2020-08-08 ENCOUNTER — Other Ambulatory Visit: Payer: Self-pay | Admitting: *Deleted

## 2020-08-08 DIAGNOSIS — K219 Gastro-esophageal reflux disease without esophagitis: Secondary | ICD-10-CM

## 2020-08-08 MED ORDER — OMEPRAZOLE 20 MG PO CPDR: 20.0000 mg | DELAYED_RELEASE_CAPSULE | Freq: Every day | ORAL | 2 refills | Status: DC

## 2020-08-20 ENCOUNTER — Ambulatory Visit (INDEPENDENT_AMBULATORY_CARE_PROVIDER_SITE_OTHER): Payer: PPO | Admitting: Nurse Practitioner

## 2020-08-20 ENCOUNTER — Encounter: Payer: Self-pay | Admitting: Nurse Practitioner

## 2020-08-20 ENCOUNTER — Other Ambulatory Visit: Payer: Self-pay

## 2020-08-20 VITALS — BP 159/80 | HR 77 | Temp 97.3°F | Ht 69.0 in | Wt 198.0 lb

## 2020-08-20 DIAGNOSIS — K219 Gastro-esophageal reflux disease without esophagitis: Secondary | ICD-10-CM | POA: Diagnosis not present

## 2020-08-20 DIAGNOSIS — R030 Elevated blood-pressure reading, without diagnosis of hypertension: Secondary | ICD-10-CM

## 2020-08-20 DIAGNOSIS — C61 Malignant neoplasm of prostate: Secondary | ICD-10-CM

## 2020-08-20 LAB — CBC WITH DIFFERENTIAL/PLATELET
Basophils Absolute: 0.1 10*3/uL (ref 0.0–0.2)
Basos: 1 %
EOS (ABSOLUTE): 0.2 10*3/uL (ref 0.0–0.4)
Eos: 3 %
Hematocrit: 46.7 % (ref 37.5–51.0)
Hemoglobin: 16 g/dL (ref 13.0–17.7)
Immature Grans (Abs): 0 10*3/uL (ref 0.0–0.1)
Immature Granulocytes: 1 %
Lymphocytes Absolute: 2.3 10*3/uL (ref 0.7–3.1)
Lymphs: 36 %
MCH: 30 pg (ref 26.6–33.0)
MCHC: 34.3 g/dL (ref 31.5–35.7)
MCV: 88 fL (ref 79–97)
Monocytes Absolute: 0.5 10*3/uL (ref 0.1–0.9)
Monocytes: 7 %
Neutrophils Absolute: 3.4 10*3/uL (ref 1.4–7.0)
Neutrophils: 52 %
Platelets: 199 10*3/uL (ref 150–450)
RBC: 5.33 x10E6/uL (ref 4.14–5.80)
RDW: 13.1 % (ref 11.6–15.4)
WBC: 6.4 10*3/uL (ref 3.4–10.8)

## 2020-08-20 MED ORDER — OMEPRAZOLE 20 MG PO CPDR: 20.0000 mg | DELAYED_RELEASE_CAPSULE | Freq: Every day | ORAL | 3 refills | Status: DC

## 2020-08-20 NOTE — Assessment & Plan Note (Signed)
Gastroesophageal reflux disease without esophagitis well-controlled on Prilosec 20 mg capsule daily by mouth. Rx refill sent to pharmacy.  Education provided to patient with printed handouts given.

## 2020-08-20 NOTE — Assessment & Plan Note (Signed)
Patient is following up for elevated blood pressure after last office visit 07/25/2020.  Patient was advised to keep blood pressure log, low-sodium diet and exercise.  Today in clinic blood pressure is still elevated 173/83, repeat 159/80.  Patient reports blood pressure is better at home and reports 144/69 and right wrist in 149/69 and left wrist.  Advised patient to bring blood pressure machine in 2 weeks to visit with a nurse and get rechecked.  Patient reports blood pressure has been the same for the last 10 years and always elevated in a doctor's office. Education provided to patient with printed handouts given patient would like to follow-up in 1 year or call if he needs to be seen.

## 2020-08-20 NOTE — Patient Instructions (Signed)

## 2020-08-20 NOTE — Progress Notes (Signed)
Established Patient Office Visit  Subjective:  Patient ID: Dwayne [name], male    DOB: 1945/12/01  Age: 75 y.o. MRN: [id number]  CC:  Chief Complaint  Patient presents with  . Medical Management of Chronic Issues    HPI KHADIM LUNDBERG presents for follow up of elevated blood pressure. Patient was diagnosed in 07/26/2019. The patient is tolerating the medication well without side effects. Compliance with treatment has been good; including taking medication as directed , maintains a healthy diet and regular exercise regimen , and following up as directed.   GERD, Follow up:  The patient was last seen for GERD 1 year ago. Changes made since that visit include started Prilosec 20 mg capsule daily.Marland Kitchen  He reports good compliance with treatment. He is not having side effects. Marland Kitchen   He is NOT experiencing abdominal bloating, bilious reflux, chest pain, choking on food, cough or deep pressure at base of neck     Past Medical History:  Diagnosis Date  . Cancer of prostate ([place])   . Cancer of skin   . Chronic kidney disease    prostate cancer   . GERD (gastroesophageal reflux disease)   . PONV (postoperative nausea and vomiting)     Past Surgical History:  Procedure Laterality Date  . CHOLECYSTECTOMY    . OTHER SURGICAL HISTORY     right shoulder surgery   . OTHER SURGICAL HISTORY     right knee arthroscopic surgery   . OTHER SURGICAL HISTORY     neck surgery   . ROBOT ASSISTED LAPAROSCOPIC RADICAL PROSTATECTOMY  07/16/2011   Procedure: ROBOTIC ASSISTED LAPAROSCOPIC RADICAL PROSTATECTOMY;  Surgeon: Bernestine Amass, MD;  Location: [facility];  Service: Urology;  Laterality: N/A;  Robtic Assisted Laproscopic Radical Retropubic Prostatetomy      Family History  Problem Relation Age of Onset  . Heart attack Father   . Cancer Brother   . Leukemia Brother     Social History   Socioeconomic History  . Marital status: Married    Spouse name: Not on file  . Number of children:  Not on file  . Years of education: Not on file  . Highest education level: Not on file  Occupational History  . Not on file  Tobacco Use  . Smoking status: Former Research scientist (life sciences)  . Smokeless tobacco: Former Network engineer  . Vaping Use: Never used  Substance and Sexual Activity  . Alcohol use: No  . Drug use: No  . Sexual activity: Not on file  Other Topics Concern  . Not on file  Social History Narrative  . Not on file   Social Determinants of Health   Financial Resource Strain: Not on file  Food Insecurity: Not on file  Transportation Needs: Not on file  Physical Activity: Not on file  Stress: Not on file  Social Connections: Not on file  Intimate Partner Violence: Not on file    Outpatient Medications Prior to Visit  Medication Sig Dispense Refill  . aspirin EC 81 MG tablet Take 81 mg by mouth daily.    . Multiple Vitamin (MULTIVITAMIN) tablet Take 1 tablet by mouth daily.    Marland Kitchen omeprazole (PRILOSEC) 20 MG capsule Take 1 capsule (20 mg total) by mouth daily. 30 capsule 2   No facility-administered medications prior to visit.    No Known Allergies  ROS Review of Systems  Constitutional: Negative.   HENT: Negative.   Eyes: Negative.   Respiratory: Negative.  Cardiovascular: Negative.   Musculoskeletal: Negative.   Skin: Negative.   Neurological: Negative for light-headedness and headaches.  All other systems reviewed and are negative.     Objective:    Physical Exam Vitals reviewed.  Constitutional:      Appearance: Normal appearance.  HENT:     Head: Normocephalic.     Nose: Nose normal.  Eyes:     Conjunctiva/sclera: Conjunctivae normal.  Cardiovascular:     Rate and Rhythm: Normal rate.     Pulses: Normal pulses.     Heart sounds: Normal heart sounds.  Pulmonary:     Effort: Pulmonary effort is normal.     Breath sounds: Normal breath sounds.  Abdominal:     General: Bowel sounds are normal.  Musculoskeletal:        General: Normal range of  motion.  Skin:    General: Skin is warm.  Neurological:     Mental Status: He is alert and oriented to person, place, and time.     BP (!) 159/80   Pulse 77   Temp (!) 97.3 F (36.3 C) (Temporal)   Ht 5\' 9"  (1.753 m)   Wt 198 lb (89.8 kg)   SpO2 96%   BMI 29.24 kg/m  Wt Readings from Last 3 Encounters:  08/20/20 198 lb (89.8 kg)  05/22/20 199 lb 6.4 oz (90.4 kg)  07/26/19 199 lb 9.6 oz (90.5 kg)     Health Maintenance Due  Topic Date Due  . COVID-19 Vaccine (3 - Booster for Moderna series) 02/19/2020    There are no preventive care reminders to display for this patient.  Lab Results  Component Value Date   TSH 1.180 04/08/2019   Lab Results  Component Value Date   WBC 5.8 04/08/2019   HGB 15.5 04/08/2019   HCT 45.3 04/08/2019   MCV 87 04/08/2019   PLT 198 04/08/2019   Lab Results  Component Value Date   NA 142 04/08/2019   K 4.5 04/08/2019   CO2 23 04/08/2019   GLUCOSE 91 04/08/2019   BUN 17 04/08/2019   CREATININE 0.99 04/08/2019   BILITOT 0.7 04/08/2019   ALKPHOS 68 04/08/2019   AST 19 04/08/2019   ALT 20 04/08/2019   PROT 5.9 (L) 04/08/2019   ALBUMIN 4.3 04/08/2019   CALCIUM 9.1 04/08/2019   Lab Results  Component Value Date   CHOL 168 04/08/2019   Lab Results  Component Value Date   HDL 50 04/08/2019   Lab Results  Component Value Date   LDLCALC 101 (H) 04/08/2019   Lab Results  Component Value Date   TRIG 93 04/08/2019   Lab Results  Component Value Date   CHOLHDL 3.4 04/08/2019   No results found for: HGBA1C  .ga Assessment & Plan:   Problem List Items Addressed This Visit      Digestive   Gastroesophageal reflux disease without esophagitis    Gastroesophageal reflux disease without esophagitis well-controlled on Prilosec 20 mg capsule daily by mouth. Rx refill sent to pharmacy.  Education provided to patient with printed handouts given.      Relevant Medications   omeprazole (PRILOSEC) 20 MG capsule     Other    Elevated BP without diagnosis of hypertension - Primary    Patient is following up for elevated blood pressure after last office visit 07/25/2020.  Patient was advised to keep blood pressure log, low-sodium diet and exercise.  Today in clinic blood pressure is still elevated 173/83, repeat 159/80.  Patient reports blood pressure is better at home and reports 144/69 and right wrist in 149/69 and left wrist.  Advised patient to bring blood pressure machine in 2 weeks to visit with a nurse and get rechecked.  Patient reports blood pressure has been the same for the last 10 years and always elevated in a doctor's office. Education provided to patient with printed handouts given patient would like to follow-up in 1 year or call if he needs to be seen.        Relevant Orders   CBC with Differential/Platelet   Comprehensive metabolic panel   Lipid Panel    Other Visit Diagnoses    Prostate cancer ([place])       Relevant Orders   PSA      Meds ordered this encounter  Medications  . omeprazole (PRILOSEC) 20 MG capsule    Sig: Take 1 capsule (20 mg total) by mouth daily.    Dispense:  90 capsule    Refill:  3    Order Specific Question:   Supervising Provider    Answer:   Janora Norlander [[id number]]    Follow-up: Return in about 1 year (around 08/20/2021).    Ivy Lynn, NP

## 2020-08-21 LAB — COMPREHENSIVE METABOLIC PANEL
ALT: 27 IU/L (ref 0–44)
AST: 27 IU/L (ref 0–40)
Albumin/Globulin Ratio: 2.4 — ABNORMAL HIGH (ref 1.2–2.2)
Albumin: 4.8 g/dL — ABNORMAL HIGH (ref 3.7–4.7)
Alkaline Phosphatase: 71 IU/L (ref 44–121)
BUN/Creatinine Ratio: 19 (ref 10–24)
BUN: 19 mg/dL (ref 8–27)
Bilirubin Total: 1 mg/dL (ref 0.0–1.2)
CO2: 24 mmol/L (ref 20–29)
Calcium: 9.6 mg/dL (ref 8.6–10.2)
Chloride: 105 mmol/L (ref 96–106)
Creatinine, Ser: 1.02 mg/dL (ref 0.76–1.27)
Globulin, Total: 2 g/dL (ref 1.5–4.5)
Glucose: 92 mg/dL (ref 65–99)
Potassium: 5.1 mmol/L (ref 3.5–5.2)
Sodium: 143 mmol/L (ref 134–144)
Total Protein: 6.8 g/dL (ref 6.0–8.5)
eGFR: 77 mL/min/{1.73_m2} (ref >59)

## 2020-08-21 LAB — LIPID PANEL
Chol/HDL Ratio: 3.4 ratio (ref 0.0–5.0)
Cholesterol, Total: 186 mg/dL (ref 100–199)
HDL: 55 mg/dL (ref >39)
LDL Chol Calc (NIH): 113 mg/dL — ABNORMAL HIGH (ref 0–99)
Triglycerides: 101 mg/dL (ref 0–149)
VLDL Cholesterol Cal: 18 mg/dL (ref 5–40)

## 2020-08-21 LAB — PSA: Prostate Specific Ag, Serum: <0.1 ng/mL (ref 0.0–4.0)

## 2020-12-12 ENCOUNTER — Encounter: Payer: Self-pay | Admitting: Dermatology

## 2020-12-12 ENCOUNTER — Ambulatory Visit (INDEPENDENT_AMBULATORY_CARE_PROVIDER_SITE_OTHER): Payer: PPO | Admitting: Dermatology

## 2020-12-12 ENCOUNTER — Other Ambulatory Visit: Payer: Self-pay

## 2020-12-12 DIAGNOSIS — C44319 Basal cell carcinoma of skin of other parts of face: Secondary | ICD-10-CM | POA: Diagnosis not present

## 2020-12-12 DIAGNOSIS — L570 Actinic keratosis: Secondary | ICD-10-CM | POA: Diagnosis not present

## 2020-12-12 DIAGNOSIS — Z1283 Encounter for screening for malignant neoplasm of skin: Secondary | ICD-10-CM

## 2020-12-12 DIAGNOSIS — L821 Other seborrheic keratosis: Secondary | ICD-10-CM

## 2020-12-12 DIAGNOSIS — D485 Neoplasm of uncertain behavior of skin: Secondary | ICD-10-CM

## 2020-12-12 DIAGNOSIS — C4491 Basal cell carcinoma of skin, unspecified: Secondary | ICD-10-CM

## 2020-12-12 HISTORY — DX: Basal cell carcinoma of skin, unspecified: C44.91

## 2020-12-12 NOTE — Patient Instructions (Signed)

## 2020-12-21 ENCOUNTER — Encounter: Payer: Self-pay | Admitting: Dermatology

## 2020-12-21 NOTE — Progress Notes (Signed)
   Follow-Up Visit   Subjective  Dwayne [name] is a 75 y.o. male who presents for the following: Skin Problem (Patient here today to have three lesions checked. One on the right lower leg x 1 year per patient it's getting larger, no bleeding, no pain. Checked lesions on his right cheek and left cheek x 1 month no bleeding, scaly, no pain. ).  General skin examination Location:  Duration:  Quality:  Associated Signs/Symptoms: Modifying Factors:  Severity:  Timing: Context:   Objective  Well appearing patient in no apparent distress; mood and affect are within normal limits. Torso - Posterior (Back) Full body skin examination: No atypical pigmented lesions.  1 possible nonmelanoma skin cancer right sideburn will be biopsied.  Right Parotid Area Adjacent waxy crusts, total 1.2 cm         A full examination was performed including scalp, head, eyes, ears, nose, lips, neck, chest, axillae, abdomen, back, buttocks, bilateral upper extremities, bilateral lower extremities, hands, feet, fingers, toes, fingernails, and toenails. All findings within normal limits unless otherwise noted below.  Area beneath undergarments not fully examined.   Assessment & Plan    Encounter for screening for malignant neoplasm of skin Torso - Posterior (Back)  Annual skin examination.  Neoplasm of uncertain behavior of skin Right Parotid Area  Skin / nail biopsy Type of biopsy: tangential   Informed consent: discussed and consent obtained   Timeout: patient name, date of birth, surgical site, and procedure verified   Procedure prep:  Patient was prepped and draped in usual sterile fashion (Non sterile) Prep type:  Chlorhexidine Anesthesia: the lesion was anesthetized in a standard fashion   Anesthetic:  1% lidocaine w/ epinephrine 1-100,000 local infiltration Instrument used: flexible razor blade   Hemostasis achieved with: ferric subsulfate   Outcome: patient tolerated procedure well    Post-procedure details: wound care instructions given    Specimen 1 - Surgical pathology Differential Diagnosis: bcc vs scc  Check Margins: No  AK (actinic keratosis) Left Dorsal Hand  Destruction of lesion - Left Dorsal Hand Complexity: simple   Destruction method: cryotherapy   Informed consent: discussed and consent obtained   Timeout:  patient name, date of birth, surgical site, and procedure verified Lesion destroyed using liquid nitrogen: Yes   Cryotherapy cycles:  3 Outcome: patient tolerated procedure well with no complications        I, Lavonna Monarch, MD, have reviewed all documentation for this visit.  The documentation on 12/21/20 for the exam, diagnosis, procedures, and orders are all accurate and complete.

## 2020-12-25 ENCOUNTER — Other Ambulatory Visit: Payer: Self-pay

## 2020-12-25 ENCOUNTER — Telehealth: Payer: Self-pay

## 2020-12-25 ENCOUNTER — Ambulatory Visit (INDEPENDENT_AMBULATORY_CARE_PROVIDER_SITE_OTHER): Payer: PPO | Admitting: Nurse Practitioner

## 2020-12-25 ENCOUNTER — Encounter: Payer: Self-pay | Admitting: Nurse Practitioner

## 2020-12-25 VITALS — BP 161/72 | HR 66 | Temp 97.6°F | Ht 69.0 in | Wt 195.0 lb

## 2020-12-25 DIAGNOSIS — H9072 Mixed conductive and sensorineural hearing loss, unilateral, left ear, with unrestricted hearing on the contralateral side: Secondary | ICD-10-CM

## 2020-12-25 DIAGNOSIS — H9192 Unspecified hearing loss, left ear: Secondary | ICD-10-CM

## 2020-12-25 MED ORDER — PREDNISONE 20 MG PO TABS: 60.0000 mg | ORAL_TABLET | Freq: Every day | ORAL | 0 refills | Status: DC

## 2020-12-25 NOTE — Patient Instructions (Signed)
Hearing Loss ?Hearing loss is a partial or total loss of the ability to hear. This can be temporary or permanent, and it can happen in one or both ears. ?Medical care is necessary to treat hearing loss properly and to prevent the condition from getting worse. Your hearing may partially or completely come back, depending on what caused your hearing loss and how severe it is. In some cases, hearing loss is permanent. ?What are the causes? ?Common causes of hearing loss include: ?Too much wax in the ear canal. ?Infection of the ear canal or middle ear. ?Fluid in the middle ear. ?Injury to the ear or surrounding area. ?An object stuck in the ear. ?A history of prolonged exposure to loud sounds, such as music. ?Less common causes of hearing loss include: ?Tumors in the ear. ?Viral or bacterial infections, such as meningitis. ?A hole in the eardrum (perforated eardrum). ?Problems with the hearing nerve that sends signals between the brain and the ear. ?Certain medicines. ?What are the signs or symptoms? ?Symptoms of this condition may include: ?Difficulty telling the difference between sounds. ?Difficulty following a conversation when there is background noise. ?Lack of response to sounds in your environment. This may be most noticeable when you do not respond to startling sounds. ?Needing to turn up the volume on the television, radio, or other devices. ?Ringing in the ears. ?Dizziness. ?How is this diagnosed? ?This condition is diagnosed based on: ?A physical exam. ?A hearing test (audiometry). The audiometry test will be performed by a hearing specialist (audiologist). ?You may also be referred to an ear, nose, and throat (ENT) specialist (otolaryngologist). ?How is this treated? ?Treatment for hearing loss may include: ?Ear wax removal. ?Medicines to treat or prevent infection (antibiotics). ?Medicines to reduce inflammation (corticosteroids). ?Hearing aids for hearing loss related to nerve damage. ?Follow these  instructions at home: ?If you were prescribed an antibiotic medicine, take it as told by your health care provider. Do not stop taking the antibiotic even if you start to feel better. ?Take over-the-counter and prescription medicines only as told by your health care provider. ?Avoid loud noises. ?Return to your normal activities as told by your health care provider. Ask your health care provider what activities are safe for you. ?Keep all follow-up visits as told by your health care provider. This is important. ?Contact a health care provider if: ?You feel dizzy. ?You develop new symptoms. ?You vomit or feel nauseous. ?You have a fever. ?Get help right away if: ?You develop sudden changes in your vision. ?You have severe ear pain. ?You have new or increased weakness. ?You have a severe headache. ?Summary ?Hearing loss is a decreased ability to hear sounds around you. It can be temporary or permanent. ?Treatment will depend on the cause of your hearing loss. It may include ear wax removal, medicines, or a hearing aid. ?Your hearing may partially or completely come back, depending on what caused your hearing loss and how severe it is. ?Keep all follow-up visits as told by your health care provider. This is important. ?This information is not intended to replace advice given to you by your health care provider. Make sure you discuss any questions you have with your health care provider. ?Document Revised: 02/09/2018 Document Reviewed: 02/09/2018 ?Elsevier Patient Education ? 2022 [employer] ? ?

## 2020-12-25 NOTE — Progress Notes (Signed)
Acute Office Visit  Subjective:    Patient ID: Dwayne [name], male    DOB: [birth year], 75 y.o.   MRN: [id number]  Chief Complaint  Patient presents with   Ear Fullness    Ear Fullness  There is pain in the left ear. This is a new problem. The current episode started 1 to 4 weeks ago. The problem occurs constantly. The problem has been unchanged. There has been no fever. The patient is experiencing no pain. Associated symptoms include headaches and hearing loss. Pertinent negatives include no rash. He has tried nothing for the symptoms.     Past Medical History:  Diagnosis Date   Cancer of prostate ([place])    Cancer of skin    Chronic kidney disease    prostate cancer    GERD (gastroesophageal reflux disease)    PONV (postoperative nausea and vomiting)    Superficial basal cell carcinoma (BCC) 12/12/2020   Right Parotid Area    Past Surgical History:  Procedure Laterality Date   CHOLECYSTECTOMY     OTHER SURGICAL HISTORY     right shoulder surgery    OTHER SURGICAL HISTORY     right knee arthroscopic surgery    OTHER SURGICAL HISTORY     neck surgery    ROBOT ASSISTED LAPAROSCOPIC RADICAL PROSTATECTOMY  07/16/2011   Procedure: ROBOTIC ASSISTED [place];  Surgeon: Bernestine Amass, MD;  Location: [facility];  Service: Urology;  Laterality: N/A;  Robtic Assisted Laproscopic Radical Retropubic Prostatetomy      Family History  Problem Relation Age of Onset   Heart attack Father    Cancer Brother    Leukemia Brother     Social History   Socioeconomic History   Marital status: Married    Spouse name: Not on file   Number of children: Not on file   Years of education: Not on file   Highest education level: Not on file  Occupational History   Not on file  Tobacco Use   Smoking status: Former   Smokeless tobacco: Former  Scientific laboratory technician Use: Never used  Substance and Sexual Activity   Alcohol use: No   Drug use: No   Sexual activity:  Not on file  Other Topics Concern   Not on file  Social History Narrative   Not on file   Social Determinants of Health   Financial Resource Strain: Not on file  Food Insecurity: Not on file  Transportation Needs: Not on file  Physical Activity: Not on file  Stress: Not on file  Social Connections: Not on file  Intimate Partner Violence: Not on file    Outpatient Medications Prior to Visit  Medication Sig Dispense Refill   aspirin EC 81 MG tablet Take 81 mg by mouth daily.     Multiple Vitamin (MULTIVITAMIN) tablet Take 1 tablet by mouth daily.     omeprazole (PRILOSEC) 20 MG capsule Take 1 capsule (20 mg total) by mouth daily. 90 capsule 3   No facility-administered medications prior to visit.    No Known Allergies  Review of Systems  Constitutional: Negative.   HENT:  Positive for hearing loss.   Eyes: Negative.   Respiratory: Negative.    Gastrointestinal: Negative.   Genitourinary: Negative.   Skin:  Negative for rash.  Neurological:  Positive for headaches.  Psychiatric/Behavioral: Negative.        Objective:    Physical Exam Vitals and nursing note reviewed.  Constitutional:  Appearance: Normal appearance.  HENT:     Head: Normocephalic.     Right Ear: Hearing normal.     Left Ear: Decreased hearing noted. No swelling or tenderness. There is no impacted cerumen. No foreign body.     Nose: Nose normal.  Eyes:     Conjunctiva/sclera: Conjunctivae normal.  Cardiovascular:     Rate and Rhythm: Normal rate and regular rhythm.  Pulmonary:     Effort: Pulmonary effort is normal.     Breath sounds: Normal breath sounds.  Abdominal:     General: Bowel sounds are normal.  Neurological:     Mental Status: He is alert and oriented to person, place, and time.  Psychiatric:        Behavior: Behavior normal.    BP (!) 161/72   Pulse 66   Temp 97.6 F (36.4 C) (Temporal)   Ht '5\' 9"'  (1.753 m)   Wt 195 lb (88.5 kg)   SpO2 97%   BMI 28.80 kg/m  Wt  Readings from Last 3 Encounters:  12/25/20 195 lb (88.5 kg)  08/20/20 198 lb (89.8 kg)  05/22/20 199 lb 6.4 oz (90.4 kg)    Health Maintenance Due  Topic Date Due   Zoster Vaccines- Shingrix (1 of 2) Never done   COVID-19 Vaccine (3 - Moderna risk series) 09/16/2019   INFLUENZA VACCINE  12/24/2020    There are no preventive care reminders to display for this patient.   Lab Results  Component Value Date   TSH 1.180 04/08/2019   Lab Results  Component Value Date   WBC 6.4 08/20/2020   HGB 16.0 08/20/2020   HCT 46.7 08/20/2020   MCV 88 08/20/2020   PLT 199 08/20/2020   Lab Results  Component Value Date   NA 143 08/20/2020   K 5.1 08/20/2020   CO2 24 08/20/2020   GLUCOSE 92 08/20/2020   BUN 19 08/20/2020   CREATININE 1.02 08/20/2020   BILITOT 1.0 08/20/2020   ALKPHOS 71 08/20/2020   AST 27 08/20/2020   ALT 27 08/20/2020   PROT 6.8 08/20/2020   ALBUMIN 4.8 (H) 08/20/2020   CALCIUM 9.6 08/20/2020   EGFR 77 08/20/2020   Lab Results  Component Value Date   CHOL 186 08/20/2020   Lab Results  Component Value Date   HDL 55 08/20/2020   Lab Results  Component Value Date   LDLCALC 113 (H) 08/20/2020   Lab Results  Component Value Date   TRIG 101 08/20/2020   Lab Results  Component Value Date   CHOLHDL 3.4 08/20/2020   No results found for: HGBA1C     Assessment & Plan:   Problem List Items Addressed This Visit       Nervous and Auditory   Hearing loss of left ear - Primary    On assessment patient is unable to hear from his left ear symptoms started suddenly 3 weeks ago.  No fever, headache, dizziness or pain associated with new symptoms.  Started patient on 60 mg prednisone tablet by mouth daily for 10 days.  MRI ordered to rule out any type of tumor results pending.  Follow-up with worsening unresolved symptoms.       Relevant Medications   predniSONE (DELTASONE) 20 MG tablet   Other Relevant Orders   MR BRAIN/IAC W WO CONTRAST     Meds  ordered this encounter  Medications   predniSONE (DELTASONE) 20 MG tablet    Sig: Take 3 tablets (60 mg total) by  mouth daily with breakfast.    Dispense:  30 tablet    Refill:  0    Order Specific Question:   Supervising Provider    Answer:   Janora Norlander [[id number]]     Ivy Lynn, NP

## 2020-12-25 NOTE — Telephone Encounter (Signed)
-----   Message from Lavonna Monarch, MD sent at 12/24/2020  7:27 PM EDT ----- Schedule surgery with Dr. Darene Lamer

## 2020-12-25 NOTE — Assessment & Plan Note (Signed)
On assessment patient is unable to hear from his left ear symptoms started suddenly 3 weeks ago.  No fever, headache, dizziness or pain associated with new symptoms.  Started patient on 60 mg prednisone tablet by mouth daily for 10 days.  MRI ordered to rule out any type of tumor results pending.  Follow-up with worsening unresolved symptoms.

## 2020-12-25 NOTE — Telephone Encounter (Signed)
Phone call to patient with his pathology results. Patient aware of results.  

## 2021-01-07 ENCOUNTER — Other Ambulatory Visit: Payer: Self-pay

## 2021-01-07 ENCOUNTER — Ambulatory Visit (HOSPITAL_COMMUNITY)
Admission: RE | Admit: 2021-01-07 | Discharge: 2021-01-07 | Disposition: A | Discharge status: Home / Self Care | Payer: PPO | Source: Ambulatory Visit | Attending: Nurse Practitioner | Admitting: Nurse Practitioner

## 2021-01-07 DIAGNOSIS — H905 Unspecified sensorineural hearing loss: Secondary | ICD-10-CM | POA: Diagnosis not present

## 2021-01-07 DIAGNOSIS — H9072 Mixed conductive and sensorineural hearing loss, unilateral, left ear, with unrestricted hearing on the contralateral side: Secondary | ICD-10-CM | POA: Insufficient documentation

## 2021-01-07 DIAGNOSIS — G319 Degenerative disease of nervous system, unspecified: Secondary | ICD-10-CM | POA: Diagnosis not present

## 2021-01-07 DIAGNOSIS — H9190 Unspecified hearing loss, unspecified ear: Secondary | ICD-10-CM | POA: Diagnosis not present

## 2021-01-07 MED ORDER — GADOBUTROL 1 MMOL/ML IV SOLN
10.0000 mL | Freq: Once | INTRAVENOUS | Status: AC | PRN
  Administered 2021-01-07: 10 mL via INTRAVENOUS

## 2021-01-10 ENCOUNTER — Telehealth: Payer: Self-pay | Admitting: Nurse Practitioner

## 2021-01-10 DIAGNOSIS — H938X2 Other specified disorders of left ear: Secondary | ICD-10-CM

## 2021-01-10 DIAGNOSIS — E237 Disorder of pituitary gland, unspecified: Secondary | ICD-10-CM

## 2021-01-10 MED ORDER — FLUTICASONE PROPIONATE 50 MCG/ACT NA SUSP: 1.0000 | Freq: Two times a day (BID) | NASAL | 6 refills | Status: DC | PRN

## 2021-01-10 MED ORDER — AMOXICILLIN 500 MG PO CAPS: 500.0000 mg | ORAL_CAPSULE | Freq: Two times a day (BID) | ORAL | 0 refills | Status: DC

## 2021-01-10 NOTE — Telephone Encounter (Signed)
MRI did show an enlarged pituitary gland. Depending on what the patient's symptoms are, PCP may need to order additional lab work and/or imaging. PCP will return tomorrow and can determine next steps.

## 2021-01-10 NOTE — Addendum Note (Signed)
Addended by: Caryl Pina on: 01/10/2021 05:10 PM   Modules accepted: Orders

## 2021-01-10 NOTE — Telephone Encounter (Signed)
Spoke to patient's wife about MRI and ordered mri of pituitary with contrast.  Left effusion, sent amoxicillin and Flonase for left ear congestion noted on MRI.  Caryl Pina, MD [facility] 01/10/2021, 5:10 PM

## 2021-01-10 NOTE — Telephone Encounter (Signed)
Dr. Chapman Moss is calling patient tonight. F/u with PCP or Je tomorrow

## 2021-01-22 ENCOUNTER — Other Ambulatory Visit: Payer: Self-pay

## 2021-01-22 ENCOUNTER — Ambulatory Visit (HOSPITAL_COMMUNITY)
Admission: RE | Admit: 2021-01-22 | Discharge: 2021-01-22 | Disposition: A | Discharge status: Home / Self Care | Payer: PPO | Source: Ambulatory Visit | Attending: Family Medicine | Admitting: Family Medicine

## 2021-01-22 DIAGNOSIS — E237 Disorder of pituitary gland, unspecified: Secondary | ICD-10-CM | POA: Insufficient documentation

## 2021-01-22 DIAGNOSIS — I6789 Other cerebrovascular disease: Secondary | ICD-10-CM | POA: Diagnosis not present

## 2021-01-22 DIAGNOSIS — G319 Degenerative disease of nervous system, unspecified: Secondary | ICD-10-CM | POA: Diagnosis not present

## 2021-01-22 MED ORDER — GADOBUTROL 1 MMOL/ML IV SOLN
9.0000 mL | Freq: Once | INTRAVENOUS | Status: AC | PRN
  Administered 2021-01-22: 9 mL via INTRAVENOUS

## 2021-01-24 ENCOUNTER — Other Ambulatory Visit: Payer: Self-pay

## 2021-01-24 ENCOUNTER — Encounter: Payer: Self-pay | Admitting: Family Medicine

## 2021-01-24 ENCOUNTER — Ambulatory Visit (INDEPENDENT_AMBULATORY_CARE_PROVIDER_SITE_OTHER): Payer: PPO | Admitting: Family Medicine

## 2021-01-24 ENCOUNTER — Ambulatory Visit (INDEPENDENT_AMBULATORY_CARE_PROVIDER_SITE_OTHER): Payer: PPO | Admitting: Dermatology

## 2021-01-24 DIAGNOSIS — C44319 Basal cell carcinoma of skin of other parts of face: Secondary | ICD-10-CM | POA: Diagnosis not present

## 2021-01-24 DIAGNOSIS — D497 Neoplasm of unspecified behavior of endocrine glands and other parts of nervous system: Secondary | ICD-10-CM

## 2021-01-24 NOTE — Progress Notes (Signed)
   There were no vitals taken for this visit.   Subjective:   Patient ID: Dwayne [name], male    DOB: [birth year], 75 y.o.   MRN: [id number]  HPI: Dwayne [name] is a 75 y.o. male presenting on 01/24/2021 for No chief complaint on file.   HPI Patient is coming in today for discussion of MRI results.  It does show that he has a pituitary tumor with possible invasion or spread, will do referral to neurosurgery, discussed this with the patient and he was understanding.  His main symptom that is that he gets some headaches with ringing in his left ear and pressure in his left ear which I do not know if it necessarily fits this tumor as the cause for that.  He says his headaches are mild and dull.  Relevant past medical, surgical, family and social history reviewed and updated as indicated. Interim medical history since our last visit reviewed. Allergies and medications reviewed and updated.  Review of Systems  HENT:  Positive for congestion and tinnitus.   Eyes:  Negative for photophobia, pain, redness and visual disturbance.  Neurological:  Positive for headaches.   Per HPI unless specifically indicated above   Allergies as of 01/24/2021   No Known Allergies      Medication List        Accurate as of January 24, 2021  3:44 PM. If you have any questions, ask your nurse or doctor.          amoxicillin 500 MG capsule Commonly known as: AMOXIL Take 1 capsule (500 mg total) by mouth 2 (two) times daily.   aspirin EC 81 MG tablet Take 81 mg by mouth daily.   fluticasone 50 MCG/ACT nasal spray Commonly known as: FLONASE Place 1 spray into both nostrils 2 (two) times daily as needed for allergies or rhinitis.   multivitamin tablet Take 1 tablet by mouth daily.   omeprazole 20 MG capsule Commonly known as: PRILOSEC Take 1 capsule (20 mg total) by mouth daily.   predniSONE 20 MG tablet Commonly known as: DELTASONE Take 3 tablets (60 mg total) by mouth daily with breakfast.          Objective:   There were no vitals taken for this visit.  Wt Readings from Last 3 Encounters:  12/25/20 195 lb (88.5 kg)  08/20/20 198 lb (89.8 kg)  05/22/20 199 lb 6.4 oz (90.4 kg)    Physical Exam Vitals and nursing note reviewed.  Constitutional:      Appearance: Normal appearance.  HENT:     Right Ear: Tympanic membrane and ear canal normal. There is no impacted cerumen. Tympanic membrane is not retracted.     Left Ear: Ear canal normal. There is no impacted cerumen. Tympanic membrane is retracted. Tympanic membrane is not injected, scarred, perforated or erythematous.  Neurological:     Mental Status: He is alert.      Assessment & Plan:   Problem List Items Addressed This Visit   None Visit Diagnoses     Pituitary tumor    -  Primary   Relevant Orders   Ambulatory referral to Neurosurgery       Placed referral neurosurgery Follow up plan: No follow-ups on file.  Counseling provided for all of the vaccine components Orders Placed This Encounter  Procedures   Ambulatory referral to Neurosurgery    Caryl Pina, MD [facility] 01/24/2021, 3:44 PM

## 2021-01-24 NOTE — Patient Instructions (Signed)

## 2021-01-31 DIAGNOSIS — D352 Benign neoplasm of pituitary gland: Secondary | ICD-10-CM | POA: Diagnosis not present

## 2021-01-31 DIAGNOSIS — R030 Elevated blood-pressure reading, without diagnosis of hypertension: Secondary | ICD-10-CM | POA: Diagnosis not present

## 2021-01-31 DIAGNOSIS — Z6829 Body mass index (BMI) 29.0-29.9, adult: Secondary | ICD-10-CM | POA: Diagnosis not present

## 2021-01-31 DIAGNOSIS — H7492 Unspecified disorder of left middle ear and mastoid: Secondary | ICD-10-CM | POA: Diagnosis not present

## 2021-02-01 ENCOUNTER — Encounter: Payer: Self-pay | Admitting: Dermatology

## 2021-02-01 DIAGNOSIS — D352 Benign neoplasm of pituitary gland: Secondary | ICD-10-CM | POA: Diagnosis not present

## 2021-02-01 NOTE — Progress Notes (Signed)
   Follow-Up Visit   Subjective  Dwayne [name] is a 75 y.o. male who presents for the following: Procedure (Here for treatment on Right parotid area. Superficial  BCC x 1 ).  BCC right cheek Location:  Duration:  Quality:  Associated Signs/Symptoms: Modifying Factors:  Severity:  Timing: Context:   Objective  Well appearing patient in no apparent distress; mood and affect are within normal limits. Right Parotid Area Lesion identified by nurse and Dr.Karlita Lichtman in room.     A focused examination was performed including head and neck. Relevant physical exam findings are noted in the Assessment and Plan.   Assessment & Plan    Basal cell carcinoma (BCC) of skin of other part of face Right Parotid Area  Destruction of lesion Complexity: simple   Destruction method: electrodesiccation and curettage   Informed consent: discussed and consent obtained   Timeout:  patient name, date of birth, surgical site, and procedure verified Anesthesia: the lesion was anesthetized in a standard fashion   Anesthetic:  1% lidocaine w/ epinephrine 1-100,000 local infiltration Curettage performed in three different directions: Yes   Curettage cycles:  3 Lesion length (cm):  1.5 Lesion width (cm):  1.5 Margin per side (cm):  0 Final wound size (cm):  1.5 Hemostasis achieved with:  ferric subsulfate Outcome: patient tolerated procedure well with no complications   Post-procedure details: wound care instructions given   Additional details:  Wound innoculated with 5 fluorouracil solution.      I, Dwayne Monarch, MD, have reviewed all documentation for this visit.  The documentation on 02/01/21 for the exam, diagnosis, procedures, and orders are all accurate and complete.

## 2021-02-14 ENCOUNTER — Encounter: Payer: PPO | Admitting: Dermatology

## 2021-03-18 DIAGNOSIS — H6522 Chronic serous otitis media, left ear: Secondary | ICD-10-CM | POA: Diagnosis not present

## 2021-03-18 DIAGNOSIS — H9193 Unspecified hearing loss, bilateral: Secondary | ICD-10-CM | POA: Diagnosis not present

## 2021-03-18 DIAGNOSIS — H6982 Other specified disorders of Eustachian tube, left ear: Secondary | ICD-10-CM | POA: Diagnosis not present

## 2021-04-01 DIAGNOSIS — D352 Benign neoplasm of pituitary gland: Secondary | ICD-10-CM | POA: Diagnosis not present

## 2021-04-01 DIAGNOSIS — H903 Sensorineural hearing loss, bilateral: Secondary | ICD-10-CM | POA: Diagnosis not present

## 2021-04-01 DIAGNOSIS — Z87891 Personal history of nicotine dependence: Secondary | ICD-10-CM | POA: Diagnosis not present

## 2021-04-01 DIAGNOSIS — H90A32 Mixed conductive and sensorineural hearing loss, unilateral, left ear with restricted hearing on the contralateral side: Secondary | ICD-10-CM | POA: Diagnosis not present

## 2021-04-01 DIAGNOSIS — H6982 Other specified disorders of Eustachian tube, left ear: Secondary | ICD-10-CM | POA: Diagnosis not present

## 2021-04-03 ENCOUNTER — Telehealth: Payer: Self-pay | Admitting: Nurse Practitioner

## 2021-04-03 NOTE — Telephone Encounter (Signed)
Dr. Warrick Parisian has said he cannot take patient.  Spoke with patient's wife earlier and made her aware.

## 2021-04-03 NOTE — Telephone Encounter (Signed)
I do not know that I can take on this patient at this point

## 2021-04-03 NOTE — Telephone Encounter (Signed)
Wife is aware

## 2021-04-29 ENCOUNTER — Ambulatory Visit (INDEPENDENT_AMBULATORY_CARE_PROVIDER_SITE_OTHER): Payer: PPO

## 2021-04-29 ENCOUNTER — Telehealth: Payer: Self-pay

## 2021-04-29 VITALS — Ht 69.0 in | Wt 190.0 lb

## 2021-04-29 DIAGNOSIS — Z0000 Encounter for general adult medical examination without abnormal findings: Secondary | ICD-10-CM | POA: Diagnosis not present

## 2021-04-29 NOTE — Telephone Encounter (Signed)
I don't know that I can take him right now with my full load

## 2021-04-29 NOTE — Patient Instructions (Signed)
Dwayne [name] , Thank you for taking time to come for your Medicare Wellness Visit. I appreciate your ongoing commitment to your health goals. Please review the following plan we discussed and let me know if I can assist you in the future.   Screening recommendations/referrals: Colonoscopy: No longer required due to age.  Recommended yearly ophthalmology/optometry visit for glaucoma screening and checkup Recommended yearly dental visit for hygiene and checkup  Vaccinations: Influenza vaccine: Declined.  Pneumococcal vaccine: Declined. Tdap vaccine: Declined. Shingles vaccine: Declined.   Covid-19: Done 07/21/2019 and 07/30/2019.  Advanced directives: Advance directive discussed with you today. At next visit, pick up a copy to complete at home and have notarized. Once this is complete please bring a copy in to our office so we can scan it into your chart.   Conditions/risks identified: KEEP UP THE GOOD WORK!!   Next appointment: Follow up in one year for your annual wellness visit. 2023.  Preventive Care 75 Years and Older, Male  Preventive care refers to lifestyle choices and visits with your health care provider that can promote health and wellness. What does preventive care include? A yearly physical exam. This is also called an annual well check. Dental exams once or twice a year. Routine eye exams. Ask your health care provider how often you should have your eyes checked. Personal lifestyle choices, including: Daily care of your teeth and gums. Regular physical activity. Eating a healthy diet. Avoiding tobacco and drug use. Limiting alcohol use. Practicing safe sex. Taking low doses of aspirin every day. Taking vitamin and mineral supplements as recommended by your health care provider. What happens during an annual well check? The services and screenings done by your health care provider during your annual well check will depend on your age, overall health, lifestyle risk  factors, and family history of disease. Counseling  Your health care provider may ask you questions about your: Alcohol use. Tobacco use. Drug use. Emotional well-being. Home and relationship well-being. Sexual activity. Eating habits. History of falls. Memory and ability to understand (cognition). Work and work Statistician. Screening  You may have the following tests or measurements: Height, weight, and BMI. Blood pressure. Lipid and cholesterol levels. These may be checked every 5 years, or more frequently if you are over 34 years old. Skin check. Lung cancer screening. You may have this screening every year starting at age 75 if you have a 30-pack-year history of smoking and currently smoke or have quit within the past 15 years. Fecal occult blood test (FOBT) of the stool. You may have this test every year starting at age 75. Flexible sigmoidoscopy or colonoscopy. You may have a sigmoidoscopy every 5 years or a colonoscopy every 10 years starting at age 75. Prostate cancer screening. Recommendations will vary depending on your family history and other risks. Hepatitis C blood test. Hepatitis B blood test. Sexually transmitted disease (STD) testing. Diabetes screening. This is done by checking your blood sugar (glucose) after you have not eaten for a while (fasting). You may have this done every 1-3 years. Abdominal aortic aneurysm (AAA) screening. You may need this if you are a current or former smoker. Osteoporosis. You may be screened starting at age 52 if you are at high risk. Talk with your health care provider about your test results, treatment options, and if necessary, the need for more tests. Vaccines  Your health care provider may recommend certain vaccines, such as: Influenza vaccine. This is recommended every year. Tetanus, diphtheria, and acellular pertussis (  Tdap, Td) vaccine. You may need a Td booster every 10 years. Zoster vaccine. You may need this after age  16. Pneumococcal 13-valent conjugate (PCV13) vaccine. One dose is recommended after age 65. Pneumococcal polysaccharide (PPSV23) vaccine. One dose is recommended after age 66. Talk to your health care provider about which screenings and vaccines you need and how often you need them. This information is not intended to replace advice given to you by your health care provider. Make sure you discuss any questions you have with your health care provider. Document Released: 06/08/2015 Document Revised: 01/30/2016 Document Reviewed: 03/13/2015 Elsevier Interactive Patient Education  2017 [employer] Prevention in the Home Falls can cause injuries. They can happen to people of all ages. There are many things you can do to make your home safe and to help prevent falls. What can I do on the outside of my home? Regularly fix the edges of walkways and driveways and fix any cracks. Remove anything that might make you trip as you walk through a door, such as a raised step or threshold. Trim any bushes or trees on the path to your home. Use bright outdoor lighting. Clear any walking paths of anything that might make someone trip, such as rocks or tools. Regularly check to see if handrails are loose or broken. Make sure that both sides of any steps have handrails. Any raised decks and porches should have guardrails on the edges. Have any leaves, snow, or ice cleared regularly. Use sand or salt on walking paths during winter. Clean up any spills in your garage right away. This includes oil or grease spills. What can I do in the bathroom? Use night lights. Install grab bars by the toilet and in the tub and shower. Do not use towel bars as grab bars. Use non-skid mats or decals in the tub or shower. If you need to sit down in the shower, use a plastic, non-slip stool. Keep the floor dry. Clean up any water that spills on the floor as soon as it happens. Remove soap buildup in the tub or shower  regularly. Attach bath mats securely with double-sided non-slip rug tape. Do not have throw rugs and other things on the floor that can make you trip. What can I do in the bedroom? Use night lights. Make sure that you have a light by your bed that is easy to reach. Do not use any sheets or blankets that are too big for your bed. They should not hang down onto the floor. Have a firm chair that has side arms. You can use this for support while you get dressed. Do not have throw rugs and other things on the floor that can make you trip. What can I do in the kitchen? Clean up any spills right away. Avoid walking on wet floors. Keep items that you use a lot in easy-to-reach places. If you need to reach something above you, use a strong step stool that has a grab bar. Keep electrical cords out of the way. Do not use floor polish or wax that makes floors slippery. If you must use wax, use non-skid floor wax. Do not have throw rugs and other things on the floor that can make you trip. What can I do with my stairs? Do not leave any items on the stairs. Make sure that there are handrails on both sides of the stairs and use them. Fix handrails that are broken or loose. Make sure that handrails are  as long as the stairways. Check any carpeting to make sure that it is firmly attached to the stairs. Fix any carpet that is loose or worn. Avoid having throw rugs at the top or bottom of the stairs. If you do have throw rugs, attach them to the floor with carpet tape. Make sure that you have a light switch at the top of the stairs and the bottom of the stairs. If you do not have them, ask someone to add them for you. What else can I do to help prevent falls? Wear shoes that: Do not have high heels. Have rubber bottoms. Are comfortable and fit you well. Are closed at the toe. Do not wear sandals. If you use a stepladder: Make sure that it is fully opened. Do not climb a closed stepladder. Make sure that  both sides of the stepladder are locked into place. Ask someone to hold it for you, if possible. Clearly mark and make sure that you can see: Any grab bars or handrails. First and last steps. Where the edge of each step is. Use tools that help you move around (mobility aids) if they are needed. These include: Canes. Walkers. Scooters. Crutches. Turn on the lights when you go into a dark area. Replace any light bulbs as soon as they burn out. Set up your furniture so you have a clear path. Avoid moving your furniture around. If any of your floors are uneven, fix them. If there are any pets around you, be aware of where they are. Review your medicines with your doctor. Some medicines can make you feel dizzy. This can increase your chance of falling. Ask your doctor what other things that you can do to help prevent falls. This information is not intended to replace advice given to you by your health care provider. Make sure you discuss any questions you have with your health care provider. Document Released: 03/08/2009 Document Revised: 10/18/2015 Document Reviewed: 06/16/2014 Elsevier Interactive Patient Education  2017 [employer].

## 2021-04-29 NOTE — Telephone Encounter (Signed)
Called pt for AWV. Pt and wife ask if there is any way he can change over to seeing you as his PCP because of how well you handled and explained his brain tumor dx? Thank you.

## 2021-04-29 NOTE — Progress Notes (Signed)
Subjective:   Dwayne [name] is a 75 y.o. male who presents for an Initial Medicare Annual Wellness Visit. Virtual Visit via Telephone Note  I connected with  Dwayne [name] on 04/29/21 at  9:45 AM EST by telephone and verified that I am speaking with the correct person using two identifiers.  Location: Patient: Home Provider: [facility] Persons participating in the virtual visit: patient/Nurse Health Advisor   I discussed the limitations, risks, security and privacy concerns of performing an evaluation and management service by telephone and the availability of in person appointments. The patient expressed understanding and agreed to proceed.  Interactive audio and video telecommunications were attempted between this nurse and patient, however failed, due to patient having technical difficulties OR patient did not have access to video capability.  We continued and completed visit with audio only.  Some vital signs may be absent or patient reported.   Dwayne Driver, LPN  Review of Systems     Cardiac Risk Factors include: advanced age (>47men, >41 women);hypertension;male gender     Objective:    Today's Vitals   04/29/21 0949  Weight: 190 lb (86.2 kg)  Height: 5\' 9"  (1.753 m)   Body mass index is 28.06 kg/m.  Advanced Directives 04/29/2021 07/16/2011 07/09/2011  Does Patient Have a Medical Advance Directive? No Patient does not have advance directive Patient does not have advance directive  Would patient like information on creating a medical advance directive? No - Patient declined - -  Pre-existing out of facility DNR order (yellow form or pink MOST form) - No No    Current Medications (verified) Outpatient Encounter Medications as of 04/29/2021  Medication Sig   aspirin EC 81 MG tablet Take 81 mg by mouth daily.   fluticasone (FLONASE) 50 MCG/ACT nasal spray Place 1 spray into both nostrils 2 (two) times daily as needed for allergies or rhinitis.   Multiple Vitamin  (MULTIVITAMIN) tablet Take 1 tablet by mouth daily.   omeprazole (PRILOSEC) 20 MG capsule Take 1 capsule (20 mg total) by mouth daily.   predniSONE (DELTASONE) 20 MG tablet Take 3 tablets (60 mg total) by mouth daily with breakfast.   amoxicillin (AMOXIL) 500 MG capsule Take 1 capsule (500 mg total) by mouth 2 (two) times daily. (Patient not taking: Reported on 04/29/2021)   No facility-administered encounter medications on file as of 04/29/2021.    Allergies (verified) Patient has no known allergies.   History: Past Medical History:  Diagnosis Date   Cancer of prostate ([place])    Cancer of skin    Chronic kidney disease    prostate cancer    GERD (gastroesophageal reflux disease)    PONV (postoperative nausea and vomiting)    Superficial basal cell carcinoma ([facility]) 12/12/2020   Right Parotid Area   Past Surgical History:  Procedure Laterality Date   CHOLECYSTECTOMY     OTHER SURGICAL HISTORY     right shoulder surgery    OTHER SURGICAL HISTORY     right knee arthroscopic surgery    OTHER SURGICAL HISTORY     neck surgery    ROBOT ASSISTED LAPAROSCOPIC RADICAL PROSTATECTOMY  07/16/2011   Procedure: ROBOTIC ASSISTED [place];  Surgeon: Bernestine Amass, MD;  Location: [facility];  Service: Urology;  Laterality: N/A;  Robtic Assisted Laproscopic Radical Retropubic Prostatetomy     Family History  Problem Relation Age of Onset   Heart attack Father    Cancer Brother    Leukemia Brother  Social History   Socioeconomic History   Marital status: Married    Spouse name: Not on file   Number of children: 3   Years of education: Not on file   Highest education level: Not on file  Occupational History   Not on file  Tobacco Use   Smoking status: Former   Smokeless tobacco: Former  Scientific laboratory technician Use: Never used  Substance and Sexual Activity   Alcohol use: No   Drug use: No   Sexual activity: Not on file  Other Topics Concern   Not on file   Social History Narrative   Married x 9 years 06/2021.   3 children and 3 step children.   9 grandchildren   2 great grandchildren   Social Determinants of Radio broadcast assistant Strain: Low Risk    Difficulty of Paying Living Expenses: Not hard at all  Food Insecurity: No Food Insecurity   Worried About Charity fundraiser in the Last Year: Never true   Arboriculturist in the Last Year: Never true  Transportation Needs: No Transportation Needs   Lack of Transportation (Medical): No   Lack of Transportation (Non-Medical): No  Physical Activity: Sufficiently Active   Days of Exercise per Week: 5 days   Minutes of Exercise per Session: 30 min  Stress: No Stress Concern Present   Feeling of Stress : Not at all  Social Connections: Socially Integrated   Frequency of Communication with Friends and Family: More than three times a week   Frequency of Social Gatherings with Friends and Family: More than three times a week   Attends Religious Services: More than 4 times per year   Active Member of [employer] or Organizations: Yes   Attends Music therapist: More than 4 times per year   Marital Status: Married    Tobacco Counseling Counseling given: Not Answered   Clinical Intake:  Pre-visit preparation completed: Yes  Pain : No/denies pain     BMI - recorded: 28.06 Nutritional Status: BMI 25 -29 Overweight Nutritional Risks: None Diabetes: No  How often do you need to have someone help you when you read instructions, pamphlets, or other written materials from your doctor or pharmacy?: 1 - Never  Diabetic?No  Interpreter Needed?: No  Information entered by :: MJ Severin Bou, LPN   Activities of Daily Living In your present state of health, do you have any difficulty performing the following activities: 04/29/2021  Hearing? N  Vision? N  Difficulty concentrating or making decisions? N  Walking or climbing stairs? N  Dressing or bathing? N  Doing errands,  shopping? N  Preparing Food and eating ? N  Using the Toilet? N  In the past six months, have you accidently leaked urine? N  Do you have problems with loss of bowel control? N  Managing your Medications? N  Managing your Finances? N  Housekeeping or managing your Housekeeping? N  Some recent data might be hidden    Patient Care Team: Ivy Lynn, NP as PCP - General (Nurse Practitioner)  Indicate any recent Medical Services you may have received from other than [facility] providers in the past year (date may be approximate).     Assessment:   This is a routine wellness examination for [place].  Hearing/Vision screen Hearing Screening - Comments:: Some hearing issues. Has appointment in 2023 for hearing aids. Vision Screening - Comments:: [facility] in [place]. 2021.  Dietary issues  and exercise activities discussed: Current Exercise Habits: Home exercise routine, Type of exercise: walking, Time (Minutes): 30, Frequency (Times/Week): 5, Weekly Exercise (Minutes/Week): 150, Intensity: Mild, Exercise limited by: cardiac condition(s);neurologic condition(s)   Goals Addressed             This Visit's Progress    Exercise 3x per week (30 min per time)       Maintain health.       Depression Screen PHQ 2/9 Scores 04/29/2021 12/25/2020 07/26/2019 06/28/2019 04/08/2019  PHQ - 2 Score 0 0 0 0 0    Fall Risk Fall Risk  04/29/2021 12/25/2020 08/20/2020 07/26/2019 06/28/2019  Falls in the past year? 0 0 0 0 0  Number falls in past yr: 0 - - - -  Injury with Fall? 0 - - - -  Risk for fall due to : Impaired vision - - - -  Follow up Falls prevention discussed - - - -    FALL RISK PREVENTION PERTAINING TO THE HOME:  Any stairs in or around the home? No  If so, are there any without handrails? No  Home free of loose throw rugs in walkways, pet beds, electrical cords, etc? Yes  Adequate lighting in your home to reduce risk of falls? Yes   ASSISTIVE DEVICES UTILIZED TO PREVENT  FALLS:  Life alert? No  Use of a cane, walker or w/c? No  Grab bars in the bathroom? Yes  Shower chair or bench in shower? No  Elevated toilet seat or a handicapped toilet? Yes   TIMED UP AND GO:  Was the test performed? No .  Phone visit.  Cognitive Function:     6CIT Screen 04/29/2021  What Year? 0 points  What month? 0 points  What time? 0 points  Count back from 20 0 points  Months in reverse 4 points  Repeat phrase 0 points  Total Score 4    Immunizations Immunization History  Administered Date(s) Administered   Moderna Sars-Covid-2 Vaccination 07/21/2019, 08/19/2019    TDAP status: Due, Education has been provided regarding the importance of this vaccine. Advised may receive this vaccine at local pharmacy or Health Dept. Aware to provide a copy of the vaccination record if obtained from local pharmacy or Health Dept. Verbalized acceptance and understanding.  Flu Vaccine status: Declined, Education has been provided regarding the importance of this vaccine but patient still declined. Advised may receive this vaccine at local pharmacy or Health Dept. Aware to provide a copy of the vaccination record if obtained from local pharmacy or Health Dept. Verbalized acceptance and understanding.  Pneumococcal vaccine status: Declined,  Education has been provided regarding the importance of this vaccine but patient still declined. Advised may receive this vaccine at local pharmacy or Health Dept. Aware to provide a copy of the vaccination record if obtained from local pharmacy or Health Dept. Verbalized acceptance and understanding.   Covid-19 vaccine status: Completed vaccines  Qualifies for Shingles Vaccine? Yes   Zostavax completed No   Shingrix Completed?: No.    Education has been provided regarding the importance of this vaccine. Patient has been advised to call insurance company to determine out of pocket expense if they have not yet received this vaccine. Advised may also  receive vaccine at local pharmacy or Health Dept. Verbalized acceptance and understanding.  Screening Tests Health Maintenance  Topic Date Due   Pneumonia Vaccine 52+ Years old (1 - PCV) Never done   Zoster Vaccines- Shingrix (1 of 2) Never done  COVID-19 Vaccine (3 - Moderna risk series) 09/16/2019   INFLUENZA VACCINE  Never done   COLONOSCOPY (Pts 45-61yrs Insurance coverage will need to be confirmed)  05/22/2021 (Originally 12/27/1990)   TETANUS/TDAP  05/22/2021 (Originally 12/26/1964)   Hepatitis C Screening  05/22/2021 (Originally 12/27/1963)   HPV VACCINES  Aged Out    Health Maintenance  Health Maintenance Due  Topic Date Due   Pneumonia Vaccine 63+ Years old (1 - PCV) Never done   Zoster Vaccines- Shingrix (1 of 2) Never done   COVID-19 Vaccine (3 - Moderna risk series) 09/16/2019   INFLUENZA VACCINE  Never done    Colorectal cancer screening: No longer required.   Lung Cancer Screening: (Low Dose CT Chest recommended if Age 80-80 years, 30 pack-year currently smoking OR have quit w/in 15years.) does not qualify.  Non smoker.  Additional Screening:  Hepatitis C Screening: does qualify; Completed DUE.  Vision Screening: Recommended annual ophthalmology exams for early detection of glaucoma and other disorders of the eye. Is the patient up to date with their annual eye exam?  Yes  Who is the provider or what is the name of the office in which the patient attends annual eye exams? [facility] in [place] If pt is not established with a provider, would they like to be referred to a provider to establish care? No .   Dental Screening: Recommended annual dental exams for proper oral hygiene  Community Resource Referral / Chronic Care Management: CRR required this visit?  No   CCM required this visit?  No      Plan:     I have personally reviewed and noted the following in the patient's chart:   Medical and social history Use of alcohol, tobacco or illicit drugs   Current medications and supplements including opioid prescriptions. Patient is not currently taking opioid prescriptions. Functional ability and status Nutritional status Physical activity Advanced directives List of other physicians Hospitalizations, surgeries, and ER visits in previous 12 months Vitals Screenings to include cognitive, depression, and falls Referrals and appointments  In addition, I have reviewed and discussed with patient certain preventive protocols, quality metrics, and best practice recommendations. A written personalized care plan for preventive services as well as general preventive health recommendations were provided to patient.     Dwayne Driver, LPN   01/0/9323   Nurse Notes: Phone visit. Pt at home. Nurse at [facility]. Pt declines vaccines but has had 2 Covid vaccines. Pt states he has had a colonoscopy in past. Due for Hep C blood work.

## 2021-05-08 ENCOUNTER — Ambulatory Visit: Payer: PPO | Admitting: Dermatology

## 2021-06-03 ENCOUNTER — Other Ambulatory Visit: Payer: Self-pay | Admitting: Nurse Practitioner

## 2021-06-03 DIAGNOSIS — K219 Gastro-esophageal reflux disease without esophagitis: Secondary | ICD-10-CM

## 2021-07-16 ENCOUNTER — Ambulatory Visit (INDEPENDENT_AMBULATORY_CARE_PROVIDER_SITE_OTHER): Payer: PPO | Admitting: Nurse Practitioner

## 2021-07-16 ENCOUNTER — Encounter: Payer: Self-pay | Admitting: Nurse Practitioner

## 2021-07-16 ENCOUNTER — Ambulatory Visit (INDEPENDENT_AMBULATORY_CARE_PROVIDER_SITE_OTHER): Payer: PPO

## 2021-07-16 VITALS — BP 138/79 | HR 60 | Temp 98.0°F | Ht 69.0 in | Wt 193.2 lb

## 2021-07-16 DIAGNOSIS — Z8546 Personal history of malignant neoplasm of prostate: Secondary | ICD-10-CM | POA: Diagnosis not present

## 2021-07-16 DIAGNOSIS — G44029 Chronic cluster headache, not intractable: Secondary | ICD-10-CM | POA: Diagnosis not present

## 2021-07-16 DIAGNOSIS — K219 Gastro-esophageal reflux disease without esophagitis: Secondary | ICD-10-CM | POA: Diagnosis not present

## 2021-07-16 DIAGNOSIS — I10 Essential (primary) hypertension: Secondary | ICD-10-CM

## 2021-07-16 DIAGNOSIS — H903 Sensorineural hearing loss, bilateral: Secondary | ICD-10-CM | POA: Diagnosis not present

## 2021-07-16 MED ORDER — LISINOPRIL 10 MG PO TABS
10.0000 mg | ORAL_TABLET | Freq: Every day | ORAL | 1 refills | Status: DC
Start: 2021-07-16 — End: 2021-11-29

## 2021-07-16 NOTE — Progress Notes (Addendum)
Subjective:    Patient ID: Dwayne [name], male    DOB: October 24, 1945, 76 y.o.   MRN: [id number]   Chief Complaint: Medical Management of Chronic Issues    HPI:  Dwayne [name] is a 76 y.o. who identifies as a male who was assigned male at birth.   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 Gastroesophageal reflux disease without esophagitis Is on omeprazole daily and is doing well.  2. Elevated BP without diagnosis of hypertension He has been having frequent headaches and blurred vision. His wife has been checkig his blood pressure at home and it has been running around upper 373'S to 287 systolic. BP Readings from Last 3 Encounters:  07/16/21 138/79  12/25/20 (!) 161/72  08/20/20 (!) 159/80      New complaints: None today  No Known Allergies Outpatient Encounter Medications as of 07/16/2021  Medication Sig   aspirin EC 81 MG tablet Take 81 mg by mouth daily.   fluticasone (FLONASE) 50 MCG/ACT nasal spray Place 1 spray into both nostrils 2 (two) times daily as needed for allergies or rhinitis.   Multiple Vitamin (MULTIVITAMIN) tablet Take 1 tablet by mouth daily.   omeprazole (PRILOSEC) 20 MG capsule TAKE ONE CAPSULE BY MOUTH DAILY   [DISCONTINUED] amoxicillin (AMOXIL) 500 MG capsule Take 1 capsule (500 mg total) by mouth 2 (two) times daily. (Patient not taking: Reported on 04/29/2021)   [DISCONTINUED] predniSONE (DELTASONE) 20 MG tablet Take 3 tablets (60 mg total) by mouth daily with breakfast.   No facility-administered encounter medications on file as of 07/16/2021.    Past Surgical History:  Procedure Laterality Date   CHOLECYSTECTOMY     OTHER SURGICAL HISTORY     right shoulder surgery    OTHER SURGICAL HISTORY     right knee arthroscopic surgery    OTHER SURGICAL HISTORY     neck surgery    ROBOT ASSISTED LAPAROSCOPIC RADICAL PROSTATECTOMY  07/16/2011   Procedure:  ROBOTIC ASSISTED LAPAROSCOPIC RADICAL PROSTATECTOMY;  Surgeon: Bernestine Amass, MD;  Location: [facility];  Service: Urology;  Laterality: N/A;  Robtic Assisted Laproscopic Radical Retropubic Prostatetomy      Family History  Problem Relation Age of Onset   Heart attack Father    Cancer Brother    Leukemia Brother       Controlled substance contract: n/a     Review of Systems  Constitutional:  Negative for diaphoresis.  Eyes:  Negative for pain.  Respiratory:  Negative for shortness of breath.   Cardiovascular:  Negative for chest pain, palpitations and leg swelling.  Gastrointestinal:  Negative for abdominal pain.  Endocrine: Negative for polydipsia.  Skin:  Negative for rash.  Neurological:  Positive for headaches. Negative for dizziness and weakness.  Hematological:  Does not bruise/bleed easily.  All other systems reviewed and are negative.     Objective:   Physical Exam Vitals and nursing note reviewed.  Constitutional:      Appearance: Normal appearance. He is well-developed.  HENT:     Head: Normocephalic.     Nose: Nose normal.     Mouth/Throat:     Mouth: Mucous membranes are moist.     Pharynx: Oropharynx is clear.  Eyes:     Pupils: Pupils are equal, round, and reactive to light.  Neck:     Thyroid: No thyroid mass or thyromegaly.  No carotid bruit or JVD.  °   Trachea: Phonation normal.  °Cardiovascular:  °   Rate and Rhythm: Normal rate and regular rhythm.  °Pulmonary:  °   Effort: Pulmonary effort is normal. No respiratory distress.  °   Breath sounds: Normal breath sounds.  °Abdominal:  °   General: Bowel sounds are normal.  °   Palpations: Abdomen is soft.  °   Tenderness: There is no abdominal tenderness.  °Musculoskeletal:     °   General: Normal range of motion.  °   Cervical back: Normal range of motion and neck supple.  °   Right lower leg: No edema.  °   Left lower leg: No edema.  °Lymphadenopathy:  °   Cervical: No cervical adenopathy.   °Skin: °   General: Skin is warm and dry.  °Neurological:  °   Mental Status: He is alert and oriented to person, place, and time.  °Psychiatric:     °   Behavior: Behavior normal.     °   Thought Content: Thought content normal.     °   Judgment: Judgment normal.  ° ° °BP 138/79 Comment: at home reading   Pulse 60    Temp 98 °F (36.7 °C) (Temporal)    Ht 5' 9" (1.753 m)    Wt 193 lb 4 oz (87.7 kg)    BMI 28.54 kg/m²  ° °EKG- NSR-Mary-Margaret Martin, FNP ° ° °   °Assessment & Plan:  ° °Harol L Niemann comes in today with chief complaint of Medical Management of Chronic Issues ° ° °Diagnosis and orders addressed: ° °1. Gastroesophageal reflux disease without esophagitis °Avoid spicy foods °Do not eat 2 hours prior to bedtime ° ° °2. Primary hypertension °Started on lisinorpil 10mg daily °Continue to keep check of blood pressure at home °- EKG 12-Lead °- DG Chest 2 View °- CBC with Differential/Platelet °- CMP14+EGFR °- Lipid panel °- lisinopril (ZESTRIL) 10 MG tablet; Take 1 tablet (10 mg total) by mouth daily.  Dispense: 90 tablet; Refill: 1 ° °3. Chronic cluster headache, not intractable °Ordered MRI °- MR Brain W Wo Contrast; Future ° °Meds ordered this encounter  °Medications  ° lisinopril (ZESTRIL) 10 MG tablet  °  Sig: Take 1 tablet (10 mg total) by mouth daily.  °  Dispense:  90 tablet  °  Refill:  1  °  Order Specific Question:   Supervising Provider  °  Answer:   DETTINGER, JOSHUA A [[id number]]  ° ° °Labs pending °Health Maintenance reviewed °Diet and exercise encouraged ° °Follow up plan: °6 months ° ° °Mary-Margaret Martin, FNP ° ° ° °

## 2021-07-16 NOTE — Patient Instructions (Signed)

## 2021-07-16 NOTE — Addendum Note (Signed)
Addended by: Chevis Pretty on: 07/16/2021 01:06 PM   Modules accepted: Orders

## 2021-07-17 LAB — CMP14+EGFR
ALT: 19 IU/L (ref 0–44)
AST: 22 IU/L (ref 0–40)
Albumin/Globulin Ratio: 2.7 — ABNORMAL HIGH (ref 1.2–2.2)
Albumin: 4.8 g/dL — ABNORMAL HIGH (ref 3.7–4.7)
Alkaline Phosphatase: 73 IU/L (ref 44–121)
BUN/Creatinine Ratio: 18 (ref 10–24)
BUN: 18 mg/dL (ref 8–27)
Bilirubin Total: 0.8 mg/dL (ref 0.0–1.2)
CO2: 23 mmol/L (ref 20–29)
Calcium: 9.4 mg/dL (ref 8.6–10.2)
Chloride: 109 mmol/L — ABNORMAL HIGH (ref 96–106)
Creatinine, Ser: 1.01 mg/dL (ref 0.76–1.27)
Globulin, Total: 1.8 g/dL (ref 1.5–4.5)
Glucose: 84 mg/dL (ref 70–99)
Potassium: 4.5 mmol/L (ref 3.5–5.2)
Sodium: 147 mmol/L — ABNORMAL HIGH (ref 134–144)
Total Protein: 6.6 g/dL (ref 6.0–8.5)
eGFR: 78 mL/min/{1.73_m2} (ref >59)

## 2021-07-17 LAB — CBC WITH DIFFERENTIAL/PLATELET
Basophils Absolute: 0 10*3/uL (ref 0.0–0.2)
Basos: 1 %
EOS (ABSOLUTE): 0.2 10*3/uL (ref 0.0–0.4)
Eos: 3 %
Hematocrit: 46.4 % (ref 37.5–51.0)
Hemoglobin: 15.5 g/dL (ref 13.0–17.7)
Immature Grans (Abs): 0 10*3/uL (ref 0.0–0.1)
Immature Granulocytes: 1 %
Lymphocytes Absolute: 2.1 10*3/uL (ref 0.7–3.1)
Lymphs: 32 %
MCH: 29.7 pg (ref 26.6–33.0)
MCHC: 33.4 g/dL (ref 31.5–35.7)
MCV: 89 fL (ref 79–97)
Monocytes Absolute: 0.5 10*3/uL (ref 0.1–0.9)
Monocytes: 8 %
Neutrophils Absolute: 3.7 10*3/uL (ref 1.4–7.0)
Neutrophils: 55 %
Platelets: 203 10*3/uL (ref 150–450)
RBC: 5.22 x10E6/uL (ref 4.14–5.80)
RDW: 13.3 % (ref 11.6–15.4)
WBC: 6.5 10*3/uL (ref 3.4–10.8)

## 2021-07-17 LAB — PSA, TOTAL AND FREE
PSA, Free: <0.02 ng/mL
Prostate Specific Ag, Serum: <0.1 ng/mL (ref 0.0–4.0)

## 2021-07-17 LAB — LIPID PANEL
Chol/HDL Ratio: 4 ratio (ref 0.0–5.0)
Cholesterol, Total: 188 mg/dL (ref 100–199)
HDL: 47 mg/dL (ref >39)
LDL Chol Calc (NIH): 120 mg/dL — ABNORMAL HIGH (ref 0–99)
Triglycerides: 116 mg/dL (ref 0–149)
VLDL Cholesterol Cal: 21 mg/dL (ref 5–40)

## 2021-08-06 ENCOUNTER — Other Ambulatory Visit: Payer: Self-pay

## 2021-08-06 ENCOUNTER — Ambulatory Visit (HOSPITAL_COMMUNITY)
Admission: RE | Admit: 2021-08-06 | Discharge: 2021-08-06 | Disposition: A | Discharge status: Home / Self Care | Payer: PPO | Source: Ambulatory Visit | Attending: Nurse Practitioner | Admitting: Nurse Practitioner

## 2021-08-06 DIAGNOSIS — R519 Headache, unspecified: Secondary | ICD-10-CM | POA: Diagnosis not present

## 2021-08-06 DIAGNOSIS — G44029 Chronic cluster headache, not intractable: Secondary | ICD-10-CM | POA: Diagnosis not present

## 2021-08-06 MED ORDER — GADOBUTROL 1 MMOL/ML IV SOLN
8.0000 mL | Freq: Once | INTRAVENOUS | Status: AC | PRN
  Administered 2021-08-06: 8 mL via INTRAVENOUS

## 2021-11-29 ENCOUNTER — Other Ambulatory Visit: Payer: Self-pay | Admitting: Family Medicine

## 2021-11-29 ENCOUNTER — Other Ambulatory Visit: Payer: Self-pay | Admitting: Nurse Practitioner

## 2021-11-29 DIAGNOSIS — I10 Essential (primary) hypertension: Secondary | ICD-10-CM

## 2021-11-29 DIAGNOSIS — H938X2 Other specified disorders of left ear: Secondary | ICD-10-CM

## 2021-11-29 NOTE — Telephone Encounter (Signed)
LOV: 07/16/2021

## 2022-01-16 ENCOUNTER — Encounter: Payer: Self-pay | Admitting: Nurse Practitioner

## 2022-01-16 ENCOUNTER — Ambulatory Visit (INDEPENDENT_AMBULATORY_CARE_PROVIDER_SITE_OTHER): Payer: PPO | Admitting: Nurse Practitioner

## 2022-01-16 VITALS — BP 142/73 | HR 62 | Temp 97.6°F | Resp 20 | Ht 69.0 in | Wt 191.0 lb

## 2022-01-16 DIAGNOSIS — I10 Essential (primary) hypertension: Secondary | ICD-10-CM

## 2022-01-16 DIAGNOSIS — E7800 Pure hypercholesterolemia, unspecified: Secondary | ICD-10-CM

## 2022-01-16 DIAGNOSIS — K219 Gastro-esophageal reflux disease without esophagitis: Secondary | ICD-10-CM | POA: Diagnosis not present

## 2022-01-16 MED ORDER — LISINOPRIL 10 MG PO TABS: ORAL_TABLET | ORAL | 1 refills | Status: DC

## 2022-01-16 NOTE — Progress Notes (Signed)
b  Subjective:    Patient ID: Dwayne [name], male    DOB: August 26, 1945, 76 y.o.   MRN: [id number]   Chief Complaint: medical management of chronic issues    HPI:  Dwayne [name] is a 76 y.o. who identifies as a male who was assigned male at birth.   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 Primary hypertension No c/o chest pain, sob or headache.does not check blood pressure at home. He only takes 1/2 lisinopril because if he takes a whole one his blood pressure gets to low. BP Readings from Last 3 Encounters:  01/16/22 (!) 142/73  07/16/21 138/79  12/25/20 (!) 161/72     2. Gastroesophageal reflux disease without esophagitis Is on omperazole daily and is doing well.  3. Hyperlpidemia Is currently on no meds. Does try to watch diet and does no dedicated exercise.  New complaints: Has naging headaches- has a tumor in brain they are watching. Is havine a scan in the next week or so.  No Known Allergies Outpatient Encounter Medications as of 01/16/2022  Medication Sig   aspirin EC 81 MG tablet Take 81 mg by mouth daily.   fluticasone (FLONASE) 50 MCG/ACT nasal spray USE 1 SPRAY IN EACH NOSTRIL TWICE DAILY AS NEEDED   lisinopril (ZESTRIL) 10 MG tablet TAKE ONE (1) TABLET BY MOUTH EVERY DAY   Multiple Vitamin (MULTIVITAMIN) tablet Take 1 tablet by mouth daily.   omeprazole (PRILOSEC) 20 MG capsule TAKE ONE CAPSULE BY MOUTH DAILY   No facility-administered encounter medications on file as of 01/16/2022.    Past Surgical History:  Procedure Laterality Date   CHOLECYSTECTOMY     OTHER SURGICAL HISTORY     right shoulder surgery    OTHER SURGICAL HISTORY     right knee arthroscopic surgery    OTHER SURGICAL HISTORY     neck surgery    ROBOT ASSISTED LAPAROSCOPIC RADICAL PROSTATECTOMY  07/16/2011   Procedure: ROBOTIC ASSISTED LAPAROSCOPIC RADICAL PROSTATECTOMY;  Surgeon: Bernestine Amass,  MD;  Location: [facility];  Service: Urology;  Laterality: N/A;  Robtic Assisted Laproscopic Radical Retropubic Prostatetomy      Family History  Problem Relation Age of Onset   Heart attack Father    Cancer Brother    Leukemia Brother       Controlled substance contract: n/a     Review of Systems  Constitutional:  Negative for diaphoresis.  Eyes:  Negative for pain.  Respiratory:  Negative for shortness of breath.   Cardiovascular:  Negative for chest pain, palpitations and leg swelling.  Gastrointestinal:  Negative for abdominal pain.  Endocrine: Negative for polydipsia.  Skin:  Negative for rash.  Neurological:  Negative for dizziness, weakness and headaches.  Hematological:  Does not bruise/bleed easily.  All other systems reviewed and are negative.      Objective:   Physical Exam Vitals and nursing note reviewed.  Constitutional:      Appearance: Normal appearance. He is well-developed.  HENT:     Head: Normocephalic.     Nose: Nose normal.     Mouth/Throat:     Mouth: Mucous membranes are moist.     Pharynx: Oropharynx is clear.  Eyes:     Pupils: Pupils are equal, round, and reactive to light.  Neck:     Thyroid: No thyroid mass or thyromegaly.     Vascular: No carotid bruit  or JVD.     Trachea: Phonation normal.  Cardiovascular:     Rate and Rhythm: Normal rate and regular rhythm.     Heart sounds:     Gallop present.  Pulmonary:     Effort: Pulmonary effort is normal. No respiratory distress.     Breath sounds: Normal breath sounds.  Abdominal:     General: Bowel sounds are normal.     Palpations: Abdomen is soft.     Tenderness: There is no abdominal tenderness.  Musculoskeletal:        General: Normal range of motion.     Cervical back: Normal range of motion and neck supple.  Lymphadenopathy:     Cervical: No cervical adenopathy.  Skin:    General: Skin is warm and dry.  Neurological:     Mental Status: He is alert and oriented to person,  place, and time.  Psychiatric:        Behavior: Behavior normal.        Thought Content: Thought content normal.        Judgment: Judgment normal.     BP (!) 142/73   Pulse 62   Temp 97.6 F (36.4 C) (Temporal)   Resp 20   Ht _0  (1.753 m)   Wt 191 lb (86.6 kg)   SpO2 98%   BMI 28.21 kg/m        Assessment & Plan:   [name] comes in today with chief complaint of Medical Management of Chronic Issues   Diagnosis and orders addressed:  1. Primary hypertension Low sodium diet - lisinopril (ZESTRIL) 10 MG tablet; TAKE ONE (1) TABLET BY MOUTH EVERY DAY  Dispense: 90 tablet; Refill: 1 - CBC with Differential/Platelet - CMP14+EGFR  2. Gastroesophageal reflux disease without esophagitis Avoid spicy foods Do not eat 2 hours prior to bedtime  3. Pure hypercholesterolemia The cholesterol is elevated. He should follow a low fat, low cholesterol diet. Repeat test in 6-12 months. - Lipid panel   Labs pending Health Maintenance reviewed Diet and exercise encouraged  Follow up plan: 6 months   Mary-Margaret Hassell Done, FNP

## 2022-01-16 NOTE — Patient Instructions (Signed)

## 2022-01-28 DIAGNOSIS — C751 Malignant neoplasm of pituitary gland: Secondary | ICD-10-CM | POA: Diagnosis not present

## 2022-01-28 DIAGNOSIS — D352 Benign neoplasm of pituitary gland: Secondary | ICD-10-CM | POA: Diagnosis not present

## 2022-02-07 DIAGNOSIS — D352 Benign neoplasm of pituitary gland: Secondary | ICD-10-CM | POA: Diagnosis not present

## 2022-02-10 ENCOUNTER — Other Ambulatory Visit: Payer: Self-pay | Admitting: Nurse Practitioner

## 2022-02-10 DIAGNOSIS — K219 Gastro-esophageal reflux disease without esophagitis: Secondary | ICD-10-CM

## 2022-03-23 IMAGING — MR MR HEAD WO/W CM
17 of 20 series · 38 of 48 positions shown · IV contrast (gadavist)
Comparison: MRI 01/07/2021.

EXAM:
MRI HEAD WITHOUT AND WITH CONTRAST
TECHNIQUE: Multiplanar, multiecho pulse sequences of the brain and surrounding
structures were obtained without and with intravenous contrast.

CONTRAST:  9mL GADAVIST GADOBUTROL 1 MMOL/ML IV SOLN

[Series 5: DWI · axial · 3.0mm · 0.77mm/px · z∈[-44,+110]mm · 4 of 48 slices shown (1 of 3)]
[im 1/48]
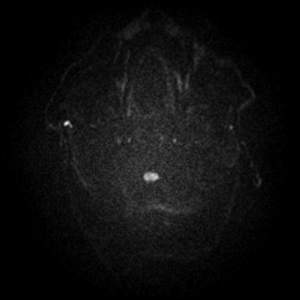
[im 16/48]
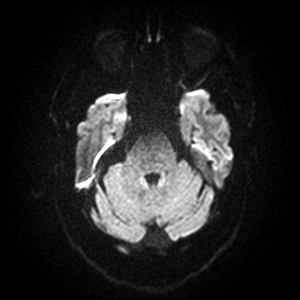
[im 32/48]
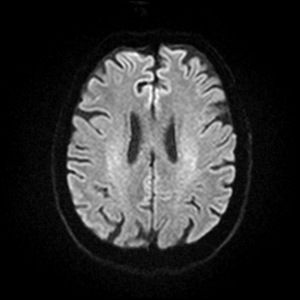
[im 48/48]
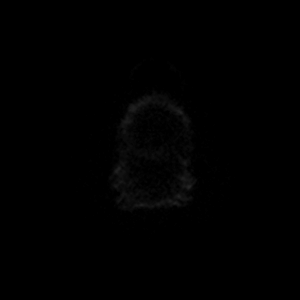

[Series 5: DWI · axial · 3.0mm · 0.77mm/px · z∈[-44,+110]mm · 3 of 48 slices shown (2 of 3)]
[im 1/48]
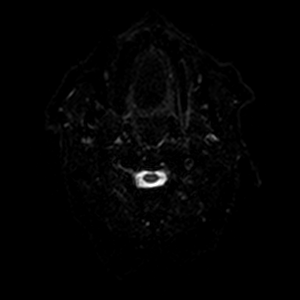
[im 24/48]
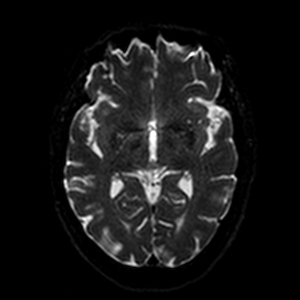
[im 48/48]
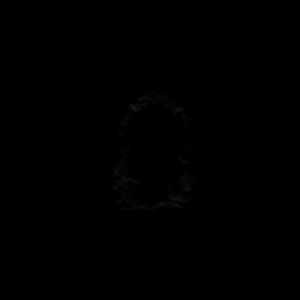

[Series 6: DWI · axial · 3.0mm · 0.77mm/px · z∈[-44,+110]mm · 3 of 48 slices shown (3 of 3)]
[im 1/48]
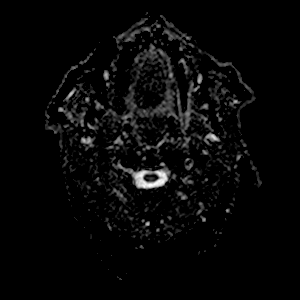
[im 24/48]
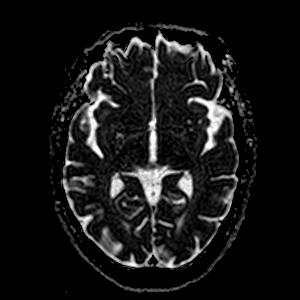
[im 48/48]
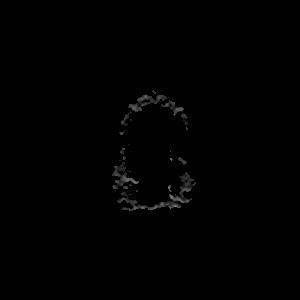

[Series 7: T1 · sagittal · 5.0mm · 0.94mm/px · 1 of 21 slices shown (1 of 3)]
[im 1/21]
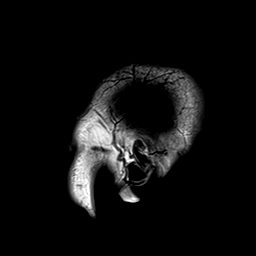

[Series 8: T2 · axial · 5.0mm · 0.72mm/px · 1 of 21 slices shown (1 of 2)]
[im 1/21]
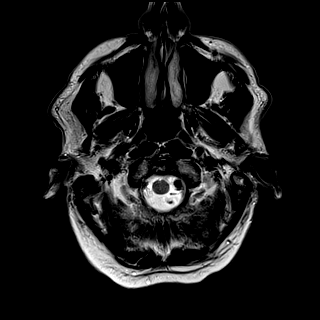

[Series 9: mag_images · axial · 3.0mm · 0.90mm/px · z∈[-48,+128]mm · 4 of 60 slices shown]
[im 1/60]
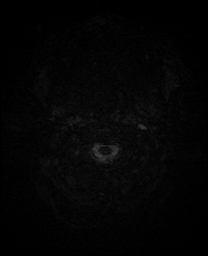
[im 20/60]
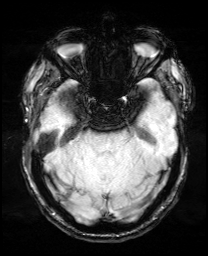
[im 40/60]
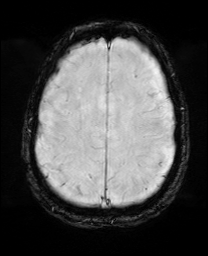
[im 60/60]
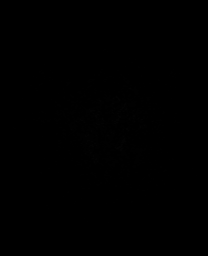

[Series 10: pha_images · axial · 3.0mm · 0.90mm/px · z∈[-48,+119]mm · 4 of 57 slices shown]
[im 1/57]
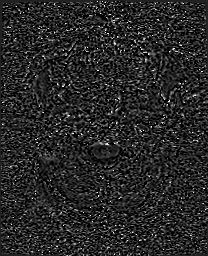
[im 19/57]
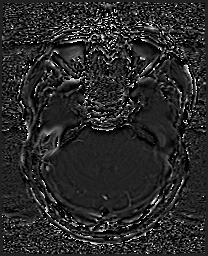
[im 38/57]
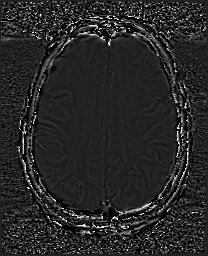
[im 57/57]
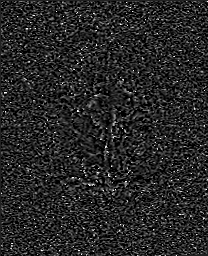

[Series 11: swi_images · axial · 3.0mm · 0.90mm/px · z∈[-48,+128]mm · 4 of 60 slices shown]
[im 1/60]
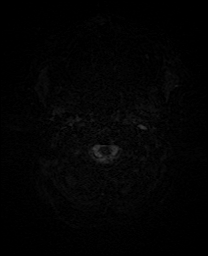
[im 20/60]
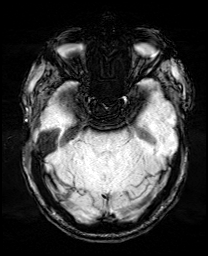
[im 40/60]
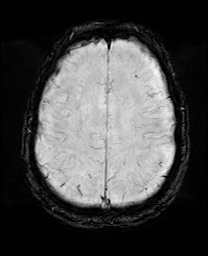
[im 60/60]
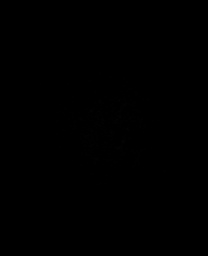

[Series 13: FLAIR · axial · 4.0mm · 0.43mm/px · z∈[-48,+112]mm · 3 of 36 slices shown]
[im 1/36]
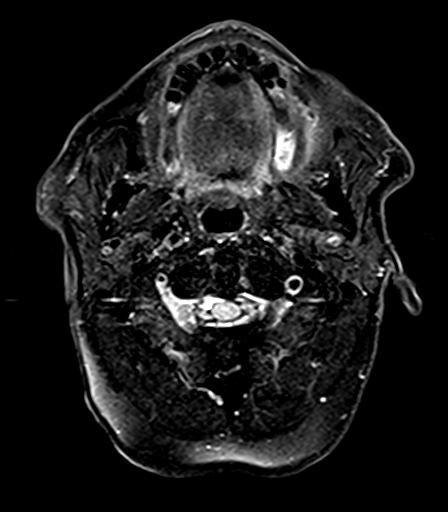
[im 18/36]
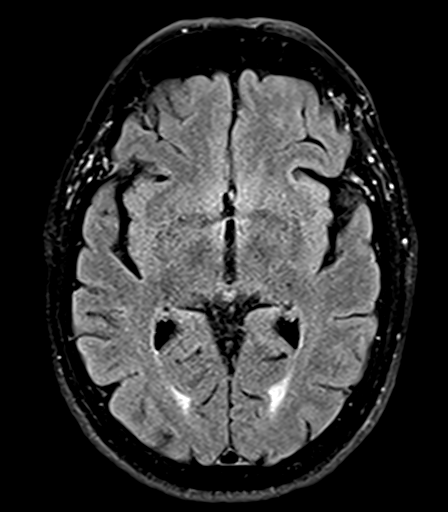
[im 36/36]
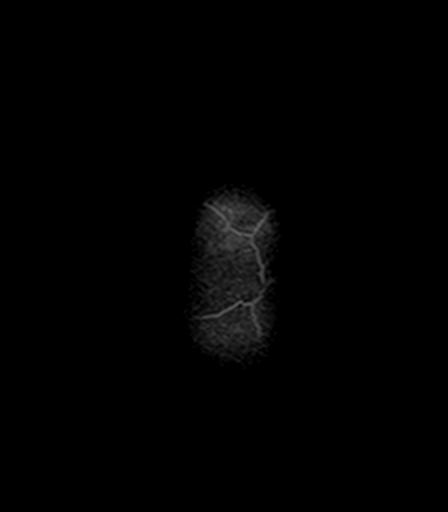

[Series 15: T1 · sagittal · 3.0mm · 0.25mm/px · 1 of 12 slices shown (2 of 3)]
[im 1/12]
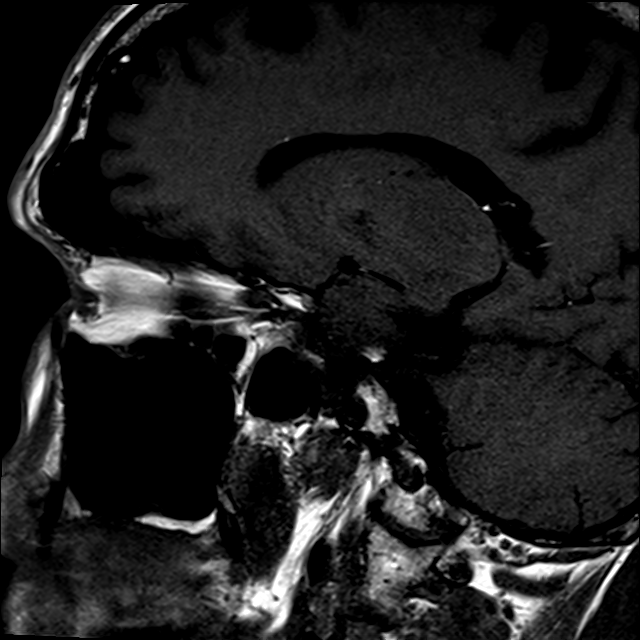

[Series 16: T1 · coronal · 3.0mm · 0.25mm/px · 1 of 13 slices shown (3 of 3)]
[im 1/13]
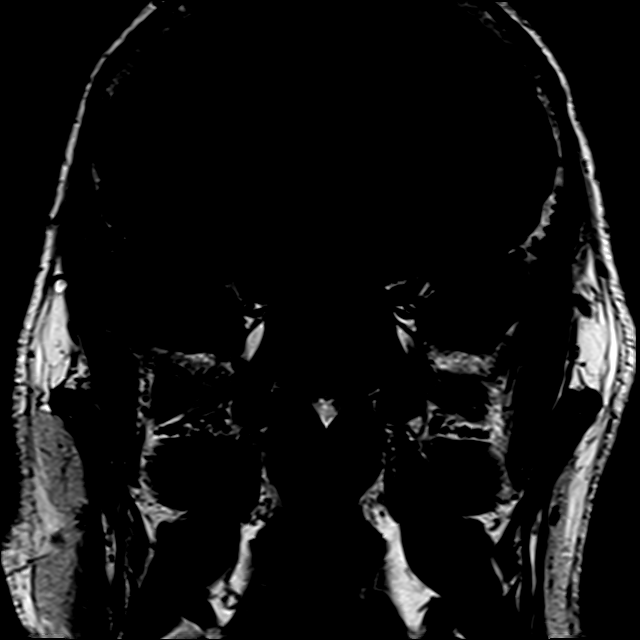

[Series 17: t1_tse_cor_dynamic pre · coronal · non-contrast · 3.0mm · 0.49mm/px · 1 of 5 slices shown]
[im 1/5]
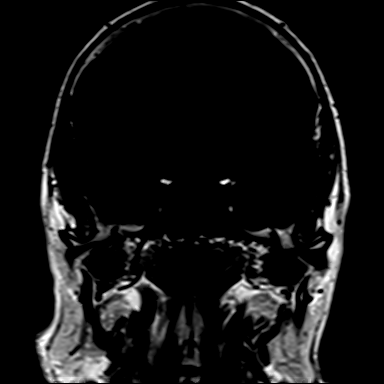

[Series 18: t1_tse_cor_dynamic post · coronal · 3.0mm · 0.49mm/px · 2 of 30 slices shown]
[im 1/30]
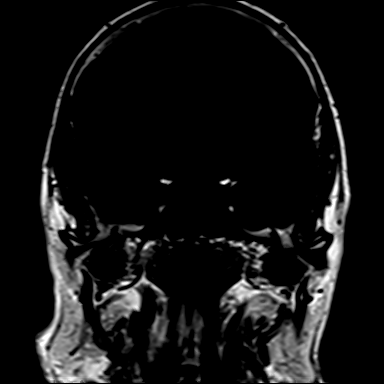
[im 30/30]
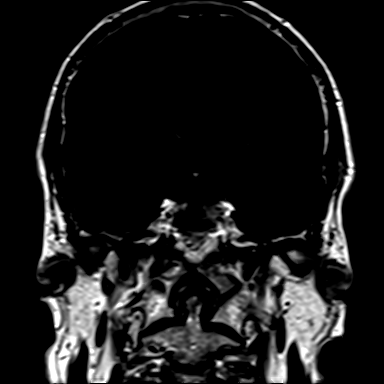

[Series 20: T1 post-contrast · sagittal · 3.0mm · 0.25mm/px · 1 of 12 slices shown (1 of 3)]
[im 1/12]
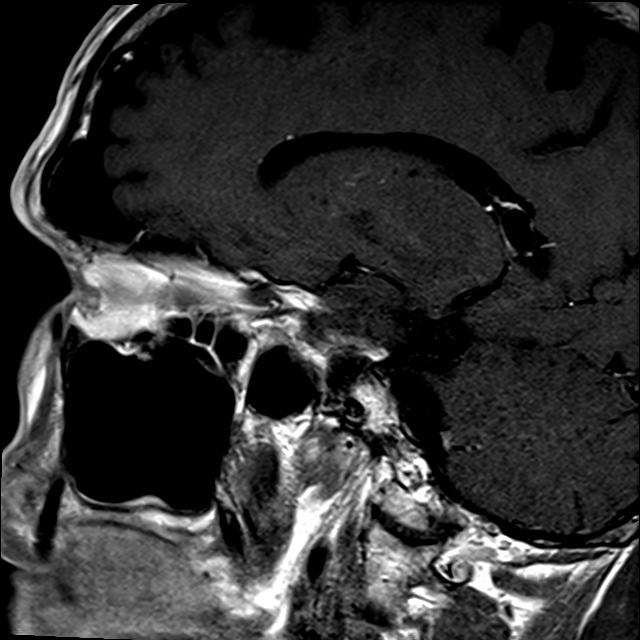

[Series 21: T1 post-contrast · coronal · 3.0mm · 0.25mm/px · 1 of 13 slices shown (2 of 3)]
[im 1/13]
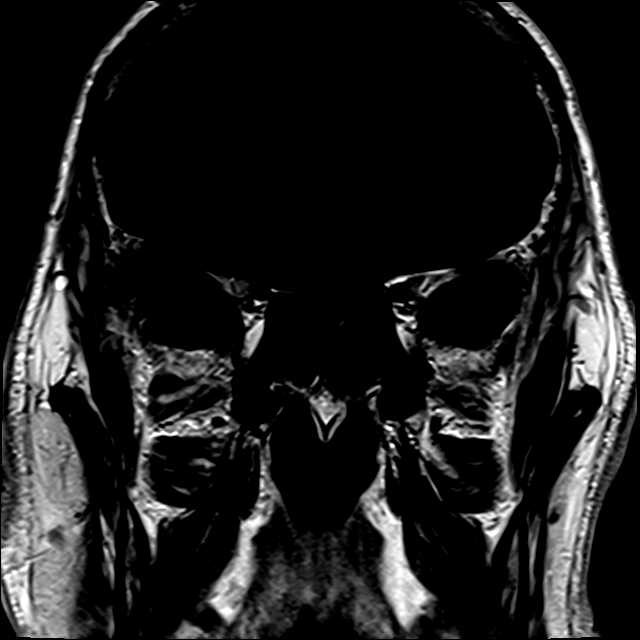

[Series 22: T2 · coronal · 5.0mm · 0.72mm/px · 2 of 26 slices shown (2 of 2)]
[im 1/26]
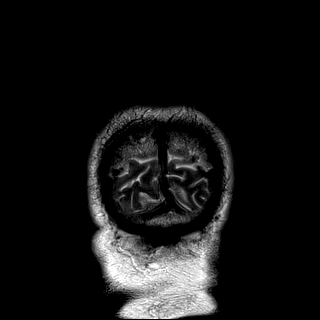
[im 26/26]
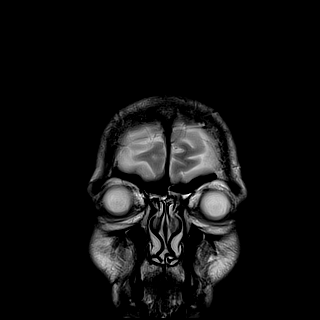

[Series 24: T1 post-contrast · coronal · 4.0mm · 0.94mm/px · 2 of 32 slices shown (3 of 3)]
[im 1/32]
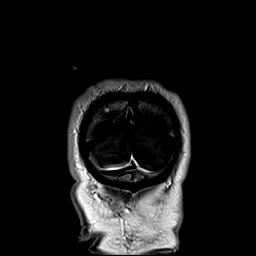
[im 32/32]
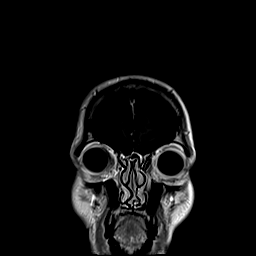

[38 of 48 positions shown; findings below may reference images not displayed]

FINDINGS: Brain: No acute infarction, hemorrhage, hydrocephalus, extra-axial
collection or mass lesion outside of the pituitary. Mild scattered
T2 hyperintensities within the supratentorial and pontine white
matter, nonspecific but compatible with chronic microvascular
ischemic disease. Mild generalized atrophy.

Dedicated pituitary protocol is performed. The pituitary gland is
enlarged, measuring up to 1.2 by 2.2 x 1.2 cm (AP by transverse by
craniocaudal) with suprasellar extension, coming into close
proximity to but not contacting the optic chiasm. Mildly
heterogeneous, but avid enhancement. The pituitary gland encroaches
on the cavernous sinuses bilaterally with possible cavernous sinus
invasion, particularly on the right. The pituitary gland exerts mild
mass effect on the infundibulum, which is otherwise unremarkable.

Vascular: Major arterial flow voids are maintained at the skull
base.

Skull and upper cervical spine: Normal marrow signal.

Sinuses/Orbits: Clear sinuses.  Unremarkable orbits.

Other: Large left mastoid effusion
IMPRESSION: 1. Enlarged and mildly heterogeneously enhancing pituitary gland,
described above and compatible with a macroadenoma. Possible
cavernous sinus invasion. Suprasellar extension with the mass coming
in close proximity to the optic chiasm.
2. Mild chronic microvascular ischemic disease.
3. Large left mastoid effusion.

## 2022-05-08 ENCOUNTER — Ambulatory Visit (INDEPENDENT_AMBULATORY_CARE_PROVIDER_SITE_OTHER): Payer: PPO | Admitting: Family Medicine

## 2022-05-08 ENCOUNTER — Encounter: Payer: Self-pay | Admitting: Family Medicine

## 2022-05-08 VITALS — BP 162/77 | HR 63 | Temp 97.9°F | Ht 69.0 in | Wt 191.2 lb

## 2022-05-08 DIAGNOSIS — M79672 Pain in left foot: Secondary | ICD-10-CM

## 2022-05-08 DIAGNOSIS — I10 Essential (primary) hypertension: Secondary | ICD-10-CM | POA: Diagnosis not present

## 2022-05-08 DIAGNOSIS — M7121 Synovial cyst of popliteal space [Baker], right knee: Secondary | ICD-10-CM | POA: Diagnosis not present

## 2022-05-08 MED ORDER — PREDNISONE 20 MG PO TABS: 20.0000 mg | ORAL_TABLET | Freq: Every day | ORAL | 0 refills | Status: AC

## 2022-05-08 NOTE — Progress Notes (Signed)
Acute Office Visit  Subjective:     Patient ID: Dwayne [name], male    DOB: [birth year], 76 y.o.   MRN: [id number]  Chief Complaint  Patient presents with   Cyst   Foot Pain    HPI Patient is in today for a cyst behind his right knee for about 2 months. He reports that it has decreased in size and is no longer painful. Denies numbness, tingling, erythema, tenderness, or swelling. He has been applying topical pain relievers and heat. He has had a prior surgery on this knee with [employer].   He has also had pain on the bottom on his left heel for 2-3 weeks. He reports that it feels like a marble there. He has had this issue about 1 year ago and it resolved with prednisone. He has been wear heel inserts with some improvement.   ROS As per HPI.      Objective:    BP (!) 162/77   Pulse 63   Temp 97.9 F (36.6 C) (Temporal)   Ht '5\' 9"'$  (1.753 m)   Wt 191 lb 4 oz (86.8 kg)   SpO2 96%   BMI 28.24 kg/m    Physical Exam Vitals and nursing note reviewed.  Constitutional:      General: He is not in acute distress.    Appearance: He is not ill-appearing, toxic-appearing or diaphoretic.  Cardiovascular:     Rate and Rhythm: Normal rate and regular rhythm.     Heart sounds: Normal heart sounds. No murmur heard. Pulmonary:     Effort: Pulmonary effort is normal.     Breath sounds: Normal breath sounds.  Abdominal:     General: Bowel sounds are normal.     Palpations: Abdomen is soft.  Musculoskeletal:     Right lower leg: No edema.     Left lower leg: No edema.     Left ankle: No swelling or ecchymosis. No tenderness. Normal range of motion.     Left Achilles Tendon: Normal.     Left foot: Normal range of motion and normal capillary refill. Tenderness (bottom of heel) present. No swelling, deformity or bony tenderness.     Comments: Baker's cyst of right knee noted. No erythema, tenderness, warmth or exudate.   Skin:    General: Skin is warm and dry.   Neurological:     General: No focal deficit present.     Mental Status: He is alert and oriented to person, place, and time.  Psychiatric:        Mood and Affect: Mood normal.        Behavior: Behavior normal.     No results found for any visits on 05/08/22.      Assessment & Plan:   Ashaz was seen today for cyst and foot pain.  Diagnoses and all orders for this visit:  Baker's cyst of knee, right Try prednisone burst as below. Discussed referral to ortho if no improvement.  -     predniSONE (DELTASONE) 20 MG tablet; Take 1 tablet (20 mg total) by mouth daily for 5 days.  Pain of left heel Previously resolved with prednisone. Burst as below.  -     predniSONE (DELTASONE) 20 MG tablet; Take 1 tablet (20 mg total) by mouth daily for 5 days.  Primary Hypertension BP not at goal in office. He reports that his BP is well controlled at home and always high in the office. Continue to monitor home BP and  notify for elevated readings.   Return if symptoms worsen or fail to improve.  The patient indicates understanding of these issues and agrees with the plan.   [name], FNP

## 2022-05-08 NOTE — Patient Instructions (Signed)
Baker Cyst  A Baker cyst, also called a popliteal cyst, is a growth that forms at the back of the knee. The cyst forms when the fluid-filled sac (bursa) that cushions the knee joint becomes enlarged. What are the causes? In most cases, a Baker cyst results from another knee problem that causes swelling inside the knee. This makes the fluid inside the knee joint (synovial fluid) flow into the bursa behind the knee, causing the bursa to enlarge. What increases the risk? You may be more likely to develop a Baker cyst if you already have a knee problem, such as: A tear in cartilage that cushions the knee joint (meniscal tear). A tear in the tissues that connect the bones of the knee joint (ligament tear). Knee swelling from osteoarthritis, rheumatoid arthritis, or gout. What are the signs or symptoms? The main symptom of this condition is a lump behind the knee. This may be the only symptom of the condition. The lump may be painful, especially when the knee is straightened. If the lump is painful, the pain may come and go. The knee may also be stiff. Symptoms may quickly get more severe if the cyst breaks open (ruptures). If the cyst ruptures, you may feel the following in your knee and calf: Sudden or worsening pain. Swelling. Bruising. Redness in the calf. A Baker cyst does not always cause symptoms. How is this diagnosed? This condition may be diagnosed based on your symptoms and medical history. Your health care provider will also do a physical exam. This may include: Feeling the cyst to check whether it is tender. Checking your knee for signs of another knee condition that causes swelling. You may have imaging tests, such as: X-rays. MRI. Ultrasound. How is this treated? A Baker cyst that is not painful may go away without treatment. If the cyst gets large or painful, it will likely get better if the underlying knee problem is treated. If needed, treatment for a Baker cyst may  include: Resting. Keeping weight off of the knee. This means not leaning on the knee to support your body weight. Taking NSAIDs, such as ibuprofen, to reduce pain and swelling. Having a procedure to drain the fluid from the cyst with a needle (aspiration). You may also get an injection of a medicine that reduces swelling (steroid). Having surgery. This may be needed if other treatments do not work. This usually involves correcting knee damage and removing the cyst. Follow these instructions at home:  Activity Rest as told by your health care provider. Avoid activities that make pain or swelling worse. Return to your normal activities as told by your health care provider. Ask your health care provider what activities are safe for you. Do not use the injured limb to support your body weight until your health care provider says that you can. Use crutches as told by your health care provider. General instructions Take over-the-counter and prescription medicines only as told by your health care provider. Keep all follow-up visits as told by your health care provider. This is important. Contact a health care provider if: You have knee pain, stiffness, or swelling that does not get better. Get help right away if: You have sudden or worsening pain and swelling in your calf area. Summary A Baker cyst, also called a popliteal cyst, is a growth that forms at the back of the knee. In most cases, a Baker cyst results from another knee problem that causes swelling inside the knee. A Baker cyst that is   not painful may go away without treatment. If needed, treatment for a Baker cyst may include resting, keeping weight off of the knee, medicines, or draining fluid from the cyst. Surgery may be needed if other treatments are not effective. This information is not intended to replace advice given to you by your health care provider. Make sure you discuss any questions you have with your health care  provider. Document Revised: 09/24/2018 Document Reviewed: 09/24/2018 [employer] Patient Education  2023 [employer]  

## 2022-07-04 ENCOUNTER — Encounter: Payer: Self-pay | Admitting: Nurse Practitioner

## 2022-07-04 ENCOUNTER — Ambulatory Visit (HOSPITAL_COMMUNITY)
Admission: RE | Admit: 2022-07-04 | Discharge: 2022-07-04 | Disposition: A | Discharge status: Home / Self Care | Payer: PPO | Source: Ambulatory Visit | Attending: Nurse Practitioner | Admitting: Nurse Practitioner

## 2022-07-04 ENCOUNTER — Ambulatory Visit (INDEPENDENT_AMBULATORY_CARE_PROVIDER_SITE_OTHER): Payer: PPO | Admitting: Nurse Practitioner

## 2022-07-04 ENCOUNTER — Ambulatory Visit (INDEPENDENT_AMBULATORY_CARE_PROVIDER_SITE_OTHER): Payer: PPO

## 2022-07-04 VITALS — BP 135/73 | HR 71 | Temp 98.1°F | Resp 20 | Ht 69.0 in | Wt 195.0 lb

## 2022-07-04 DIAGNOSIS — M25561 Pain in right knee: Secondary | ICD-10-CM

## 2022-07-04 DIAGNOSIS — M25562 Pain in left knee: Secondary | ICD-10-CM

## 2022-07-04 DIAGNOSIS — M1712 Unilateral primary osteoarthritis, left knee: Secondary | ICD-10-CM | POA: Diagnosis not present

## 2022-07-04 DIAGNOSIS — M1711 Unilateral primary osteoarthritis, right knee: Secondary | ICD-10-CM | POA: Diagnosis not present

## 2022-07-04 DIAGNOSIS — M7121 Synovial cyst of popliteal space [Baker], right knee: Secondary | ICD-10-CM | POA: Diagnosis not present

## 2022-07-04 MED ORDER — PREDNISONE 10 MG (21) PO TBPK: ORAL_TABLET | ORAL | 0 refills | Status: DC

## 2022-07-04 NOTE — Patient Instructions (Signed)
Baker Cyst  A Baker cyst, also called a popliteal cyst, is a growth that forms at the back of the knee. The cyst forms when the fluid-filled sac (bursa) that cushions the knee joint becomes enlarged. What are the causes? In most cases, a Baker cyst results from another knee problem that causes swelling inside the knee. This makes the fluid inside the knee joint (synovial fluid) flow into the bursa behind the knee, causing the bursa to enlarge. What increases the risk? You may be more likely to develop a Baker cyst if you already have a knee problem, such as: A tear in cartilage that cushions the knee joint (meniscal tear). A tear in the tissues that connect the bones of the knee joint (ligament tear). Knee swelling from osteoarthritis, rheumatoid arthritis, or gout. What are the signs or symptoms? The main symptom of this condition is a lump behind the knee. This may be the only symptom of the condition. The lump may be painful, especially when the knee is straightened. If the lump is painful, the pain may come and go. The knee may also be stiff. Symptoms may quickly get more severe if the cyst breaks open (ruptures). If the cyst ruptures, you may feel the following in your knee and calf: Sudden or worsening pain. Swelling. Bruising. Redness in the calf. A Baker cyst does not always cause symptoms. How is this diagnosed? This condition may be diagnosed based on your symptoms and medical history. Your health care provider will also do a physical exam. This may include: Feeling the cyst to check whether it is tender. Checking your knee for signs of another knee condition that causes swelling. You may have imaging tests, such as: X-rays. MRI. Ultrasound. How is this treated? A Baker cyst that is not painful may go away without treatment. If the cyst gets large or painful, it will likely get better if the underlying knee problem is treated. If needed, treatment for a Baker cyst may  include: Resting. Keeping weight off of the knee. This means not leaning on the knee to support your body weight. Taking NSAIDs, such as ibuprofen, to reduce pain and swelling. Having a procedure to drain the fluid from the cyst with a needle (aspiration). You may also get an injection of a medicine that reduces swelling (steroid). Having surgery. This may be needed if other treatments do not work. This usually involves correcting knee damage and removing the cyst. Follow these instructions at home:  Activity Rest as told by your health care provider. Avoid activities that make pain or swelling worse. Return to your normal activities as told by your health care provider. Ask your health care provider what activities are safe for you. Do not use the injured limb to support your body weight until your health care provider says that you can. Use crutches as told by your health care provider. General instructions Take over-the-counter and prescription medicines only as told by your health care provider. Keep all follow-up visits as told by your health care provider. This is important. Contact a health care provider if: You have knee pain, stiffness, or swelling that does not get better. Get help right away if: You have sudden or worsening pain and swelling in your calf area. Summary A Baker cyst, also called a popliteal cyst, is a growth that forms at the back of the knee. In most cases, a Baker cyst results from another knee problem that causes swelling inside the knee. A Baker cyst that is  not painful may go away without treatment. If needed, treatment for a Baker cyst may include resting, keeping weight off of the knee, medicines, or draining fluid from the cyst. Surgery may be needed if other treatments are not effective. This information is not intended to replace advice given to you by your health care provider. Make sure you discuss any questions you have with your health care  provider. Document Revised: 09/24/2018 Document Reviewed: 09/24/2018 Elsevier Patient Education  [place].

## 2022-07-04 NOTE — Progress Notes (Signed)
   Subjective:    Patient ID: Dwayne [name], male    DOB: June 28, 1945, 77 y.o.   MRN: [id number]   Chief Complaint: bilateral knee pain   HPI Patient comes in today c/o bil knee pain. Started about 2 months ago right knee and  a few days ago on left knee. He was seen on 05/08/22 and was told he had a bakers cyst in right knee. He was given prednisone '20mg'$  1 daily for 5 days. Did not help at all. Rates pain 5-6/10 in right knee and 8-9/10 left  knee. Sitting increases pain.    Review of Systems  Constitutional:  Negative for diaphoresis.  Eyes:  Negative for pain.  Respiratory:  Negative for shortness of breath.   Cardiovascular:  Negative for chest pain, palpitations and leg swelling.  Gastrointestinal:  Negative for abdominal pain.  Endocrine: Negative for polydipsia.  Skin:  Negative for rash.  Neurological:  Negative for dizziness, weakness and headaches.  Hematological:  Does not bruise/bleed easily.  All other systems reviewed and are negative.      Objective:   Physical Exam Constitutional:      Appearance: Normal appearance.  Cardiovascular:     Rate and Rhythm: Normal rate and regular rhythm.     Heart sounds: Normal heart sounds.  Pulmonary:     Breath sounds: Normal breath sounds.  Musculoskeletal:     Comments: Nodule  right posterior  knee Nodule left posterior knee- tightness down left calf  Neurological:     Mental Status: He is alert.     BP 135/73   Pulse 71   Temp 98.1 F (36.7 C) (Temporal)   Resp 20   Ht '5\' 9"'$  (1.753 m)   Wt 195 lb (88.5 kg)   SpO2 96%   BMI 28.80 kg/m        Assessment & Plan:   [name] in today with chief complaint of bilateral knee pain   1. Acute pain of both knees Patient instructed nit to take steroids untl he is called with doppler results. - DG Knee 1-2 Views Left - DG Knee 1-2 Views Right - US Venous Img Lower Bilateral; Future - predniSONE (STERAPRED UNI-PAK 21 TAB) 10 MG (21) TBPK tablet; As  directed x 6 days  Dispense: 21 tablet; Refill: 0    The above assessment and management plan was discussed with the patient. The patient verbalized understanding of and has agreed to the management plan. Patient is aware to call the clinic if symptoms persist or worsen. Patient is aware when to return to the clinic for a follow-up visit. Patient educated on when it is appropriate to go to the emergency department.   Mary-Margaret Hassell Done, FNP

## 2022-07-18 DIAGNOSIS — H748X2 Other specified disorders of left middle ear and mastoid: Secondary | ICD-10-CM | POA: Diagnosis not present

## 2022-07-21 ENCOUNTER — Encounter: Payer: Self-pay | Admitting: Nurse Practitioner

## 2022-07-21 ENCOUNTER — Ambulatory Visit (INDEPENDENT_AMBULATORY_CARE_PROVIDER_SITE_OTHER): Payer: PPO | Admitting: Nurse Practitioner

## 2022-07-21 VITALS — BP 134/76 | HR 92 | Temp 97.5°F | Resp 20 | Ht 69.0 in | Wt 189.0 lb

## 2022-07-21 DIAGNOSIS — Z0000 Encounter for general adult medical examination without abnormal findings: Secondary | ICD-10-CM

## 2022-07-21 DIAGNOSIS — M7120 Synovial cyst of popliteal space [Baker], unspecified knee: Secondary | ICD-10-CM

## 2022-07-21 DIAGNOSIS — I10 Essential (primary) hypertension: Secondary | ICD-10-CM | POA: Diagnosis not present

## 2022-07-21 DIAGNOSIS — Z0001 Encounter for general adult medical examination with abnormal findings: Secondary | ICD-10-CM

## 2022-07-21 DIAGNOSIS — H9192 Unspecified hearing loss, left ear: Secondary | ICD-10-CM | POA: Diagnosis not present

## 2022-07-21 DIAGNOSIS — K219 Gastro-esophageal reflux disease without esophagitis: Secondary | ICD-10-CM

## 2022-07-21 LAB — LIPID PANEL

## 2022-07-21 NOTE — Progress Notes (Signed)
Subjective:    Patient ID: Dwayne [name], male    DOB: 05/15/1946, 77 y.o.   MRN: [id number]   Chief Complaint:  annual physical  HPI:  Dwayne [name] is a 77 y.o. who identifies as a male who was assigned male at birth.   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 Primary hypertension No c/o chest pain, sob or headache. Does not check blood pressure at home.' BP Readings from Last 3 Encounters:  07/04/22 135/73  05/08/22 (!) 162/77  01/16/22 (!) 142/73     2. Gastroesophageal reflux disease without esophagitis Is on omeprazole dialy and is doing well.  3. Hearing loss of left ear, unspecified hearing loss type Wears hearing aids   New complaints: Dwayne [name] IN BIL KNEES HAS REALLY BEEN BOTHERING HIM.  No Known Allergies Outpatient Encounter Medications as of 07/21/2022  Medication Sig   aspirin EC 81 MG tablet Take 81 mg by mouth daily.   fluticasone (FLONASE) 50 MCG/ACT nasal spray USE 1 SPRAY IN EACH NOSTRIL TWICE DAILY AS NEEDED   lisinopril (ZESTRIL) 10 MG tablet TAKE ONE (1) TABLET BY MOUTH EVERY DAY   Multiple Vitamin (MULTIVITAMIN) tablet Take 1 tablet by mouth daily.   omeprazole (PRILOSEC) 20 MG capsule TAKE ONE CAPSULE BY MOUTH DAILY   predniSONE (STERAPRED UNI-PAK 21 TAB) 10 MG (21) TBPK tablet As directed x 6 days   No facility-administered encounter medications on file as of 07/21/2022.    Past Surgical History:  Procedure Laterality Date   CHOLECYSTECTOMY     OTHER SURGICAL HISTORY     right shoulder surgery    OTHER SURGICAL HISTORY     right knee arthroscopic surgery    OTHER SURGICAL HISTORY     neck surgery    ROBOT ASSISTED LAPAROSCOPIC RADICAL PROSTATECTOMY  07/16/2011   Procedure: ROBOTIC ASSISTED LAPAROSCOPIC RADICAL PROSTATECTOMY;  Surgeon: Bernestine Amass, MD;  Location: [facility];  Service: Urology;  Laterality: N/A;  Robtic Assisted Laproscopic  Radical Retropubic Prostatetomy      Family History  Problem Relation Age of Onset   Heart attack Father    Cancer Brother    Leukemia Brother       Controlled substance contract: n/a     Review of Systems  Constitutional:  Negative for diaphoresis.  Eyes:  Negative for pain.  Respiratory:  Negative for shortness of breath.   Cardiovascular:  Negative for chest pain, palpitations and leg swelling.  Gastrointestinal:  Negative for abdominal pain.  Endocrine: Negative for polydipsia.  Skin:  Negative for rash.  Neurological:  Negative for dizziness, weakness and headaches.  Hematological:  Does not bruise/bleed easily.  All other systems reviewed and are negative.      Objective:   Physical Exam Vitals and nursing note reviewed.  Constitutional:      Appearance: Normal appearance. He is well-developed.  HENT:     Head: Normocephalic.     Nose: Nose normal.     Mouth/Throat:     Mouth: Mucous membranes are moist.     Pharynx: Oropharynx is clear.  Eyes:     Pupils: Pupils are equal, round, and reactive to light.  Neck:     Thyroid: No thyroid mass or thyromegaly.     Vascular: No carotid bruit or JVD.     Trachea: Phonation normal.  Cardiovascular:     Rate and Rhythm: Normal  rate and regular rhythm.  Pulmonary:     Effort: Pulmonary effort is normal. No respiratory distress.     Breath sounds: Normal breath sounds.  Abdominal:     General: Bowel sounds are normal.     Palpations: Abdomen is soft.     Tenderness: There is no abdominal tenderness.  Musculoskeletal:        General: Normal range of motion.     Cervical back: Normal range of motion and neck supple.     Comments: Tenderness behind each knee on palpation.  Lymphadenopathy:     Cervical: No cervical adenopathy.  Skin:    General: Skin is warm and dry.  Neurological:     Mental Status: He is alert and oriented to person, place, and time.  Psychiatric:        Behavior: Behavior normal.         Thought Content: Thought content normal.        Judgment: Judgment normal.     BP 134/76   Pulse 92   Temp (!) 97.5 F (36.4 C) (Temporal)   Resp 20   Ht '5\' 9"'$  (1.753 m)   Wt 189 lb (85.7 kg)   SpO2 97%   BMI 27.91 kg/m        Assessment & Plan:  JYRIN MORIARTY comes in today with chief complaint of Annual Exam   Diagnosis and orders addressed:  1. Annual physical exam  2. Primary hypertension Low sodium diet - CBC with Differential/Platelet - CMP14+EGFR - Lipid panel  3. Gastroesophageal reflux disease without esophagitis Avoid spicy foods Do not eat 2 hours prior to bedtime  4. Hearing loss of left ear, unspecified hearing loss type Continue to wear gearing aids  5. Synovial [name] of popliteal space, unspecified laterality - Ambulatory referral to Orthopedic Surgery   Labs pending Health Maintenance reviewed Diet and exercise encouraged  Follow up plan: 6 months   [place], FNP

## 2022-07-21 NOTE — Patient Instructions (Signed)
Baker Cyst  A Baker cyst, also called a popliteal cyst, is a growth that forms at the back of the knee. The cyst forms when the fluid-filled sac (bursa) behind the knee joint fills up with more fluid and becomes enlarged. What are the causes? In most cases, a Baker cyst results from another knee problem that causes swelling in the knee. This makes the fluid inside the knee joint (synovial fluid) flow into the bursa behind the knee, causing the bursa to enlarge. The fluid flows in one direction and is unable to move back into the joint. What increases the risk? You may be more likely to develop a Baker cyst if you already have a knee problem, such as: A tear in cartilage that cushions the knee joint (meniscal tear). A tear in the tissues that connect the bones of the knee joint (ligament tear). Knee swelling from osteoarthritis, rheumatoid arthritis, or gout. What are the signs or symptoms? The main symptom of this condition is a lump behind the knee. This may be the only symptom of the condition. The lump may be painful, especially when the knee is straightened. If the lump is painful, the pain may come and go. The knee may also be stiff. Symptoms may quickly get more severe if the cyst breaks open. If the cyst breaks open, you may feel the following in your knee and calf: Sudden or worsening pain. Swelling. Bruising. Redness in the calf. A Baker cyst does not always cause symptoms. How is this diagnosed? This condition may be diagnosed based on your symptoms and medical history. Your health care provider will also do a physical exam. This may include: Feeling the cyst to check whether it is tender. Checking your knee for signs of another knee condition that causes swelling. You may have imaging tests, such as X-ray, ultrasound, or MRI. How is this treated? A Baker cyst that is not painful may go away without treatment. If the cyst gets large or painful, it will likely get better if the  underlying knee problem is treated. If needed, treatment for a Baker cyst may include: Resting. Keeping weight off the knee. This means not leaning on the knee to support your body weight. Taking NSAIDs, such as ibuprofen, to reduce pain and swelling. Draining the fluid from the cyst with a needle (aspiration). Getting a steroid injection into the knee. A steroid reduces swelling. Surgery. This may be needed if other treatments do not work. This usually involves correcting knee damage and removing the cyst. In some cases, you may also need to do certain exercises to improve movement in the knee (physical therapy). Follow these instructions at home: Activity Rest as told by your provider. Avoid activities that make pain or swelling worse. Do not use the injured limb to support your body weight until your provider says that you can. Use crutches as told by your provider. Raise (elevate) your knee above the level of your heart while you are sitting or lying down. Do physical therapy exercises as told by your provider. Return to your normal activities as told by your provider. Ask your provider what activities are safe for you. General instructions Take over-the-counter and prescription medicines only as told by your provider. Keep all follow-up visits. Your provider will monitor your healing and adjust your treatment as needed. Contact a health care provider if: You have knee pain, stiffness, or swelling that does not get better. Get help right away if: You have sudden or worsening pain and  swelling in your calf area. This information is not intended to replace advice given to you by your health care provider. Make sure you discuss any questions you have with your health care provider. Document Revised: 01/08/2022 Document Reviewed: 01/08/2022 Elsevier Patient Education  [place].

## 2022-07-22 LAB — LIPID PANEL
Chol/HDL Ratio: 3.7 ratio (ref 0.0–5.0)
Cholesterol, Total: 154 mg/dL (ref 100–199)
HDL: 42 mg/dL (ref >39)
LDL Chol Calc (NIH): 95 mg/dL (ref 0–99)
Triglycerides: 90 mg/dL (ref 0–149)
VLDL Cholesterol Cal: 17 mg/dL (ref 5–40)

## 2022-07-22 LAB — CMP14+EGFR
ALT: 21 IU/L (ref 0–44)
AST: 22 IU/L (ref 0–40)
Albumin/Globulin Ratio: 2.4 — ABNORMAL HIGH (ref 1.2–2.2)
Albumin: 4.4 g/dL (ref 3.8–4.8)
Alkaline Phosphatase: 74 IU/L (ref 44–121)
BUN/Creatinine Ratio: 19 (ref 10–24)
BUN: 18 mg/dL (ref 8–27)
Bilirubin Total: 0.8 mg/dL (ref 0.0–1.2)
CO2: 24 mmol/L (ref 20–29)
Calcium: 9.8 mg/dL (ref 8.6–10.2)
Chloride: 107 mmol/L — ABNORMAL HIGH (ref 96–106)
Creatinine, Ser: 0.94 mg/dL (ref 0.76–1.27)
Globulin, Total: 1.8 g/dL (ref 1.5–4.5)
Glucose: 94 mg/dL (ref 70–99)
Potassium: 5 mmol/L (ref 3.5–5.2)
Sodium: 145 mmol/L — ABNORMAL HIGH (ref 134–144)
Total Protein: 6.2 g/dL (ref 6.0–8.5)
eGFR: 84 mL/min/{1.73_m2} (ref >59)

## 2022-07-22 LAB — CBC WITH DIFFERENTIAL/PLATELET
Basophils Absolute: 0.1 10*3/uL (ref 0.0–0.2)
Basos: 1 %
EOS (ABSOLUTE): 0.2 10*3/uL (ref 0.0–0.4)
Eos: 2 %
Hematocrit: 47.2 % (ref 37.5–51.0)
Hemoglobin: 15.6 g/dL (ref 13.0–17.7)
Immature Grans (Abs): 0.1 10*3/uL (ref 0.0–0.1)
Immature Granulocytes: 1 %
Lymphocytes Absolute: 2.2 10*3/uL (ref 0.7–3.1)
Lymphs: 27 %
MCH: 29.4 pg (ref 26.6–33.0)
MCHC: 33.1 g/dL (ref 31.5–35.7)
MCV: 89 fL (ref 79–97)
Monocytes Absolute: 0.5 10*3/uL (ref 0.1–0.9)
Monocytes: 7 %
Neutrophils Absolute: 5.2 10*3/uL (ref 1.4–7.0)
Neutrophils: 62 %
Platelets: 230 10*3/uL (ref 150–450)
RBC: 5.31 x10E6/uL (ref 4.14–5.80)
RDW: 13 % (ref 11.6–15.4)
WBC: 8.2 10*3/uL (ref 3.4–10.8)

## 2022-07-25 DIAGNOSIS — M7121 Synovial cyst of popliteal space [Baker], right knee: Secondary | ICD-10-CM | POA: Diagnosis not present

## 2022-07-25 DIAGNOSIS — M7122 Synovial cyst of popliteal space [Baker], left knee: Secondary | ICD-10-CM | POA: Diagnosis not present

## 2022-08-01 DIAGNOSIS — M25562 Pain in left knee: Secondary | ICD-10-CM | POA: Diagnosis not present

## 2022-08-01 DIAGNOSIS — M25561 Pain in right knee: Secondary | ICD-10-CM | POA: Diagnosis not present

## 2022-08-01 DIAGNOSIS — M7121 Synovial cyst of popliteal space [Baker], right knee: Secondary | ICD-10-CM | POA: Diagnosis not present

## 2022-08-11 ENCOUNTER — Other Ambulatory Visit: Payer: Self-pay | Admitting: Nurse Practitioner

## 2022-08-11 DIAGNOSIS — K219 Gastro-esophageal reflux disease without esophagitis: Secondary | ICD-10-CM

## 2022-09-14 IMAGING — DX DG CHEST 2V
2 series · 2 of 2 positions shown · non-contrast
Comparison: None.

CLINICAL DATA: Primary hypertension.  Screening.

EXAM:
CHEST - 2 VIEW

[chest lat]
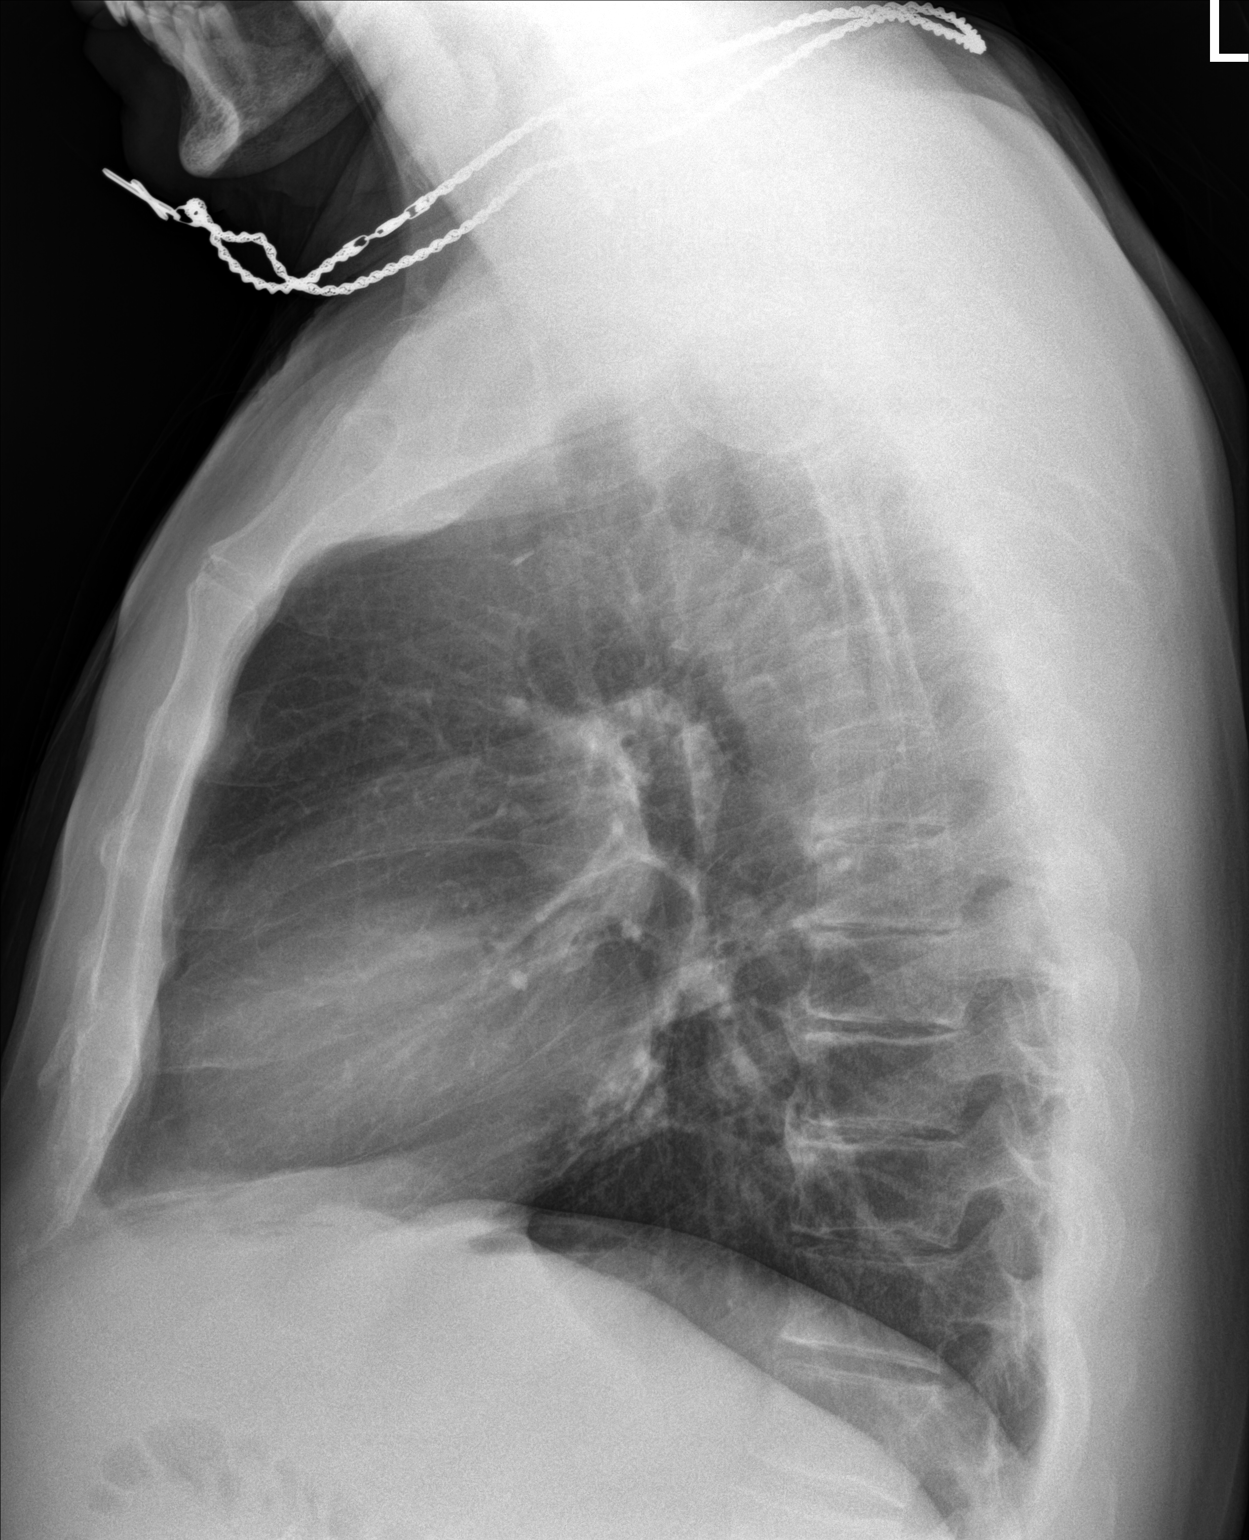

[chest pa]
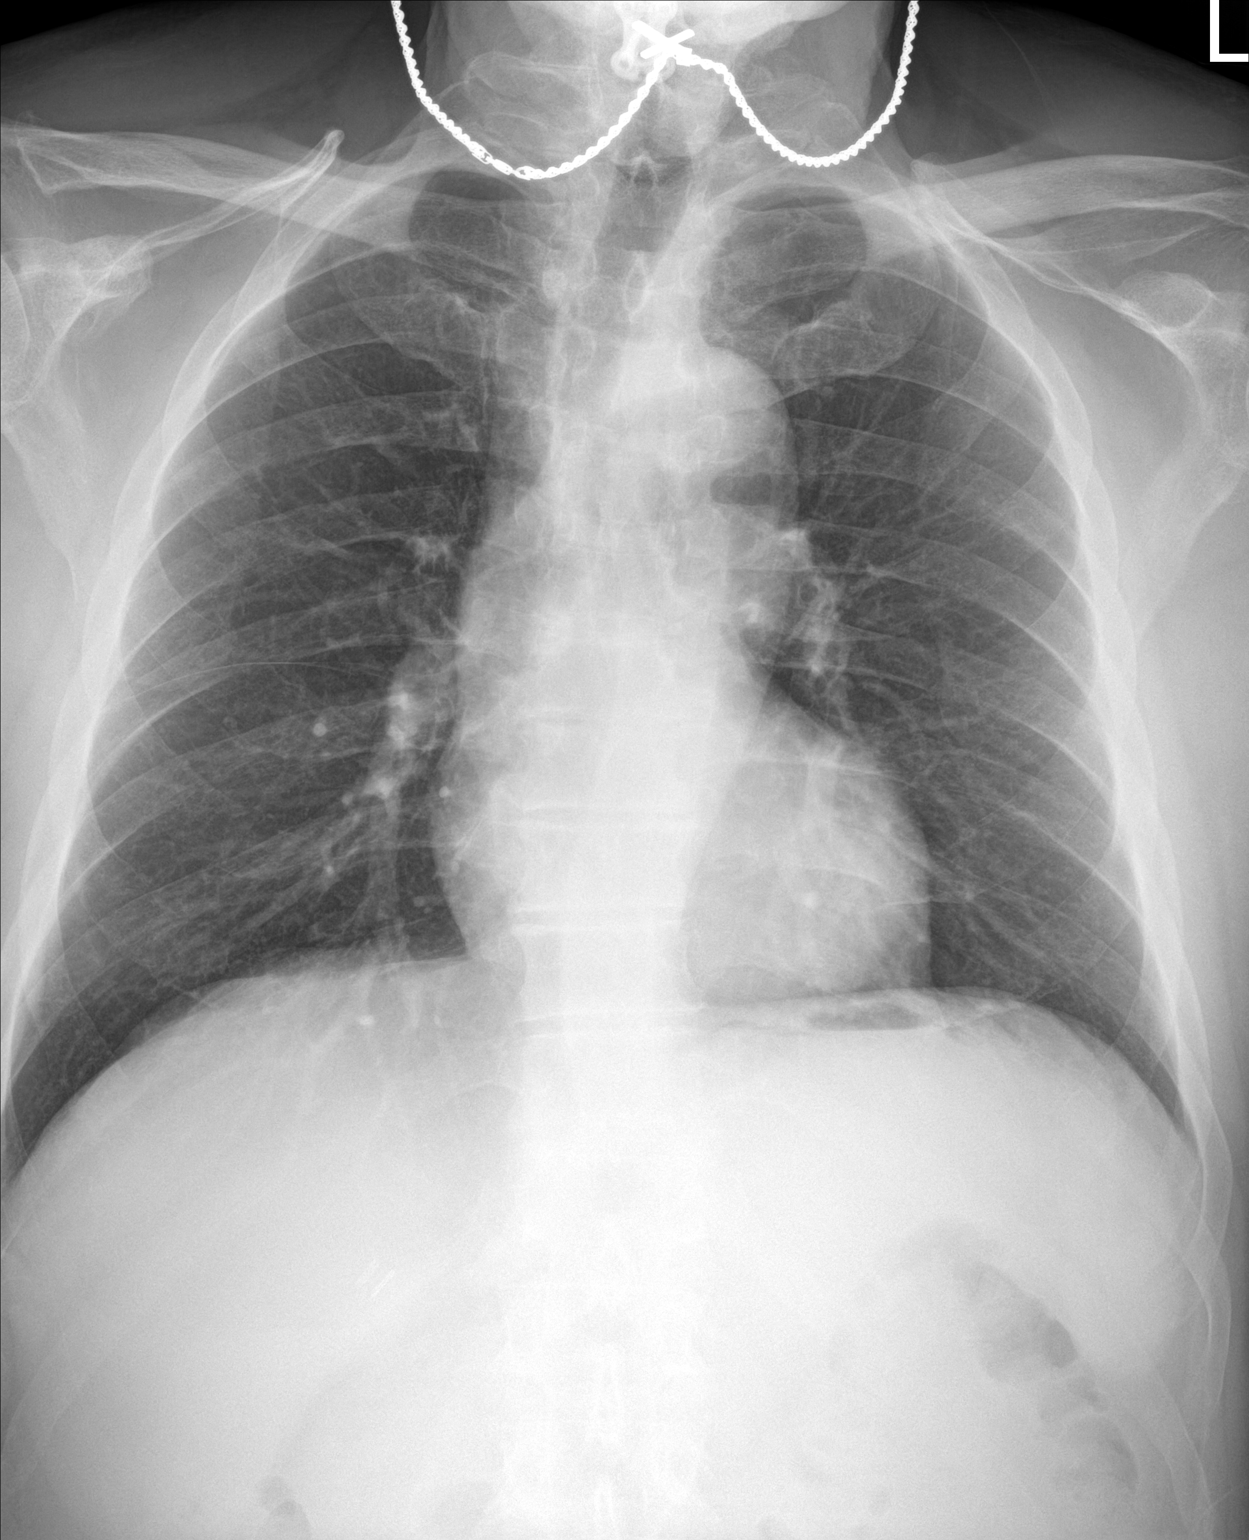

[2 of 2 positions shown; findings below may reference images not displayed]

FINDINGS: The heart is normal in size. Mild aortic tortuosity. Otherwise
normal mediastinal contours. Normal pulmonary vasculature. The right
costophrenic angle is not entirely included in the field of view.
There is no focal airspace disease, pleural effusion, or
pneumothorax. No acute osseous abnormalities are seen.
IMPRESSION: No acute chest findings. Mild aortic tortuosity.

## 2022-09-15 DIAGNOSIS — M25562 Pain in left knee: Secondary | ICD-10-CM | POA: Diagnosis not present

## 2022-09-15 DIAGNOSIS — M7122 Synovial cyst of popliteal space [Baker], left knee: Secondary | ICD-10-CM | POA: Diagnosis not present

## 2022-09-15 DIAGNOSIS — M7121 Synovial cyst of popliteal space [Baker], right knee: Secondary | ICD-10-CM | POA: Diagnosis not present

## 2022-09-18 DIAGNOSIS — H6522 Chronic serous otitis media, left ear: Secondary | ICD-10-CM | POA: Diagnosis not present

## 2022-09-18 DIAGNOSIS — H903 Sensorineural hearing loss, bilateral: Secondary | ICD-10-CM | POA: Diagnosis not present

## 2022-09-18 DIAGNOSIS — H6992 Unspecified Eustachian tube disorder, left ear: Secondary | ICD-10-CM | POA: Diagnosis not present

## 2022-09-26 DIAGNOSIS — M25562 Pain in left knee: Secondary | ICD-10-CM | POA: Diagnosis not present

## 2022-10-01 DIAGNOSIS — M1712 Unilateral primary osteoarthritis, left knee: Secondary | ICD-10-CM | POA: Diagnosis not present

## 2022-11-03 DIAGNOSIS — H90A32 Mixed conductive and sensorineural hearing loss, unilateral, left ear with restricted hearing on the contralateral side: Secondary | ICD-10-CM | POA: Diagnosis not present

## 2022-11-03 DIAGNOSIS — H6992 Unspecified Eustachian tube disorder, left ear: Secondary | ICD-10-CM | POA: Diagnosis not present

## 2022-11-03 DIAGNOSIS — H90A21 Sensorineural hearing loss, unilateral, right ear, with restricted hearing on the contralateral side: Secondary | ICD-10-CM | POA: Diagnosis not present

## 2022-11-03 DIAGNOSIS — Z9622 Myringotomy tube(s) status: Secondary | ICD-10-CM | POA: Diagnosis not present

## 2022-11-03 DIAGNOSIS — H903 Sensorineural hearing loss, bilateral: Secondary | ICD-10-CM | POA: Diagnosis not present

## 2022-11-13 ENCOUNTER — Ambulatory Visit (INDEPENDENT_AMBULATORY_CARE_PROVIDER_SITE_OTHER): Payer: PPO | Admitting: Nurse Practitioner

## 2022-11-13 ENCOUNTER — Encounter: Payer: Self-pay | Admitting: Nurse Practitioner

## 2022-11-13 VITALS — BP 140/69 | HR 79 | Temp 97.7°F | Resp 20 | Ht 69.0 in | Wt 187.0 lb

## 2022-11-13 DIAGNOSIS — K4090 Unilateral inguinal hernia, without obstruction or gangrene, not specified as recurrent: Secondary | ICD-10-CM

## 2022-11-13 NOTE — Patient Instructions (Signed)
Inguinal Hernia, Adult An inguinal hernia is when fat or your intestines push through a weak spot in a muscle where your leg meets your lower belly (groin). This causes a bulge. This kind of hernia could also be: In your scrotum, if you are male. In folds of skin around your vagina, if you are male. There are three types of inguinal hernias: Hernias that can be pushed back into the belly (are reducible). This type rarely causes pain. Hernias that cannot be pushed back into the belly (are incarcerated). Hernias that cannot be pushed back into the belly and lose their blood supply (are strangulated). This type needs emergency surgery. What are the causes? This condition is caused by having a weak spot in the muscles or tissues in your groin. This develops over time. The hernia may poke through the weak spot when you strain your lower belly muscles all of a sudden, such as when you: Lift a heavy object. Strain to poop (have a bowel movement). Trouble pooping (constipation) can lead to straining. Cough. What increases the risk? This condition is more likely to develop in: Males. Pregnant females. People who: Are overweight. Work in jobs that require long periods of standing or heavy lifting. Have had an inguinal hernia before. Smoke or have lung disease. These factors can lead to long-term (chronic) coughing. What are the signs or symptoms? Symptoms may depend on the size of the hernia. Often, a small hernia has no symptoms. Symptoms of a larger hernia may include: A bulge in the groin area. This is easier to see when standing. You might not be able to see it when you are lying down. Pain or burning in the groin. This may get worse when you lift, strain, or cough. A dull ache or a feeling of pressure in the groin. An abnormal bulge in the scrotum, in males. Symptoms of a strangulated inguinal hernia may include: A bulge in your groin that is very painful and tender to the touch. A bulge  that turns red or purple. Fever, feeling like you may vomit (nausea), and vomiting. Not being able to poop or to pass gas. How is this treated? Treatment depends on the size of your hernia and whether you have symptoms. If you do not have symptoms, your doctor may have you watch your hernia carefully and have you come in for follow-up visits. If your hernia is large or if you have symptoms, you may need surgery to repair the hernia. Follow these instructions at home: Lifestyle Avoid lifting heavy objects. Avoid standing for long amounts of time. Do not smoke or use any products that contain nicotine or tobacco. If you need help quitting, ask your doctor. Stay at a healthy weight. Prevent trouble pooping You may need to take these actions to prevent or treat trouble pooping: Drink enough fluid to keep your pee (urine) pale yellow. Take over-the-counter or prescription medicines. Eat foods that are high in fiber. These include beans, whole grains, and fresh fruits and vegetables. Limit foods that are high in fat and sugar. These include fried or sweet foods. General instructions You may try to push your hernia back in place by very gently pressing on it when you are lying down. Do not try to push the bulge back in if it will not go in easily. Watch your hernia for any changes in shape, size, or color. Tell your doctor if you see any changes. Take over-the-counter and prescription medicines only as told by your doctor. Keep   all follow-up visits. Contact a doctor if: You have a fever or chills. You have new symptoms. Your symptoms get worse. Get help right away if: You have pain in your groin that gets worse all of a sudden. You have a bulge in your groin that: Gets bigger all of a sudden, and it does not get smaller after that. Turns red or purple. Is painful when you touch it. You are a male, and you have: Sudden pain in your scrotum. A sudden change in the size of your scrotum. You  cannot push the hernia back in place by very gently pressing on it when you are lying down. You feel like you may vomit, and that feeling does not go away. You keep vomiting. You have a fast heartbeat. You cannot poop or pass gas. These symptoms may be an emergency. Get help right away. Call your local emergency services (911 in the [place] Do not wait to see if the symptoms will go away. Do not drive yourself to the hospital. Summary An inguinal hernia is when fat or your intestines push through a weak spot in a muscle where your leg meets your lower belly (groin). This causes a bulge. If you do not have symptoms, you may not need treatment. If you have symptoms or a large hernia, you may need surgery. Avoid lifting heavy objects. Also, avoid standing for long amounts of time. Do not try to push the bulge back in if it will not go in easily. This information is not intended to replace advice given to you by your health care provider. Make sure you discuss any questions you have with your health care provider. Document Revised: 01/10/2020 Document Reviewed: 01/10/2020 Elsevier Patient Education  2024 [employer]  

## 2022-11-13 NOTE — Progress Notes (Signed)
   Subjective:    Patient ID: Dwayne [name], male    DOB: 1945-06-24, 77 y.o.   MRN: [id number]   Chief Complaint: Pain in left side of groin   HPI  Patient was working on his pool filter and felt a pulling sensation in right groin. Has been bothering him every since. Has a numb feeling in area. Has been told in the past that he had a hernia on right side. Patient Active Problem List   Diagnosis Date Noted   Hearing loss of left ear 12/25/2020   Gastroesophageal reflux disease without esophagitis 05/22/2020   Hypertension 07/26/2019       Review of Systems  Constitutional:  Negative for diaphoresis.  Eyes:  Negative for pain.  Respiratory:  Negative for shortness of breath.   Cardiovascular:  Negative for chest pain, palpitations and leg swelling.  Gastrointestinal:  Negative for abdominal pain.  Endocrine: Negative for polydipsia.  Skin:  Negative for rash.  Neurological:  Negative for dizziness, weakness and headaches.  Hematological:  Does not bruise/bleed easily.  All other systems reviewed and are negative.      Objective:   Physical Exam Constitutional:      Appearance: Normal appearance.  Cardiovascular:     Rate and Rhythm: Normal rate and regular rhythm.     Heart sounds: Normal heart sounds.  Pulmonary:     Breath sounds: Normal breath sounds.  Genitourinary:    Comments: Right groin pain on palpation  Skin:    General: Skin is warm.  Neurological:     General: No focal deficit present.     Mental Status: He is alert and oriented to person, place, and time.  Psychiatric:        Mood and Affect: Mood normal.        Behavior: Behavior normal.    BP (!) 140/69   Pulse 79   Temp 97.7 F (36.5 C) (Temporal)   Resp 20   Ht 5\' 9"  (1.753 m)   Wt 187 lb (84.8 kg)   SpO2 100%   BMI 27.62 kg/m         Assessment & Plan:   [name] in today with chief complaint of Pain in left side of groin   1. Right inguinal hernia Avoid heavy  lifting' - Ambulatory referral to General Surgery    The above assessment and management plan was discussed with the patient. The patient verbalized understanding of and has agreed to the management plan. Patient is aware to call the clinic if symptoms persist or worsen. Patient is aware when to return to the clinic for a follow-up visit. Patient educated on when it is appropriate to go to the emergency department.   Mary-Margaret Daphine Deutscher, FNP

## 2022-12-04 ENCOUNTER — Ambulatory Visit (INDEPENDENT_AMBULATORY_CARE_PROVIDER_SITE_OTHER): Payer: PPO | Admitting: Surgery

## 2022-12-04 ENCOUNTER — Encounter: Payer: Self-pay | Admitting: Surgery

## 2022-12-04 VITALS — BP 161/71 | HR 64 | Temp 97.8°F | Resp 16 | Ht 69.0 in | Wt 190.0 lb

## 2022-12-04 DIAGNOSIS — K4090 Unilateral inguinal hernia, without obstruction or gangrene, not specified as recurrent: Secondary | ICD-10-CM | POA: Diagnosis not present

## 2022-12-05 NOTE — Progress Notes (Signed)
[facility] History and Physical  Reason for Referral: Right inguinal hernia Referring Physician: Bennie Pierini, NP  Chief Complaint   New Patient (Initial Visit)     Dwayne [name] is a 77 y.o. male.  HPI: Patient presents for evaluation of a right inguinal hernia.  About 1-1/2 months ago he started having right groin pain.  He describes the pain as a stinging/burning sensation.  He first noticed this issue when he was fixing a pool pump.  He denies noting a significant bulge.  This feels similar to when he had a left inguinal hernia.  He denies any nausea or vomiting and is moving his bowels with his last bowel movement being this morning.  His past medical history significant for hypertension.  He takes an 81 mg aspirin daily.  His surgical history is significant for open left inguinal hernia repair 20 years ago, cholecystectomy, and radical prostatectomy.  Past Medical History:  Diagnosis Date   Cancer of prostate (HCC)    Cancer of skin    Chronic kidney disease    prostate cancer    GERD (gastroesophageal reflux disease)    PONV (postoperative nausea and vomiting)    Superficial basal cell carcinoma (BCC) 12/12/2020   Right Parotid Area    Past Surgical History:  Procedure Laterality Date   CHOLECYSTECTOMY     OTHER SURGICAL HISTORY     right shoulder surgery    OTHER SURGICAL HISTORY     right knee arthroscopic surgery    OTHER SURGICAL HISTORY     neck surgery    ROBOT ASSISTED LAPAROSCOPIC RADICAL PROSTATECTOMY  07/16/2011   Procedure: ROBOTIC ASSISTED LAPAROSCOPIC RADICAL PROSTATECTOMY;  Surgeon: Valetta Fuller, MD;  Location: [facility];  Service: Urology;  Laterality: N/A;  Robtic Assisted Laproscopic Radical Retropubic Prostatetomy      Family History  Problem Relation Age of Onset   Heart attack Father    Cancer Brother    Leukemia Brother     Social History   Tobacco Use   Smoking status: Former   Smokeless tobacco: Former   Building services engineer status: Never Used  Substance Use Topics   Alcohol use: No   Drug use: No    Medications: I have reviewed the patient's current medications. Allergies as of 12/04/2022   No Known Allergies      Medication List        Accurate as of December 04, 2022 11:59 PM. If you have any questions, ask your nurse or doctor.          aspirin EC 81 MG tablet Take 81 mg by mouth daily.   fluticasone 50 MCG/ACT nasal spray Commonly known as: FLONASE USE 1 SPRAY IN EACH NOSTRIL TWICE DAILY AS NEEDED   lisinopril 10 MG tablet Commonly known as: ZESTRIL TAKE ONE (1) TABLET BY MOUTH EVERY DAY   multivitamin tablet Take 1 tablet by mouth daily.   omeprazole 20 MG capsule Commonly known as: PRILOSEC TAKE ONE CAPSULE BY MOUTH DAILY         ROS:  Constitutional: negative for chills, fatigue, and fevers Eyes: negative for visual disturbance and pain Ears, nose, mouth, throat, and face: negative for ear drainage, sore throat, and sinus problems Respiratory: negative for cough, wheezing, and shortness of breath Cardiovascular: negative for chest pain and palpitations Gastrointestinal: negative for abdominal pain, nausea, reflux symptoms, and vomiting Genitourinary:negative for dysuria and frequency Integument/breast: negative for dryness and rash Hematologic/lymphatic: negative for bleeding and  lymphadenopathy Musculoskeletal:negative for back pain and neck pain Neurological: negative for dizziness and tremors Endocrine: negative for temperature intolerance  Blood pressure (!) 161/71, pulse 64, temperature 97.8 F (36.6 C), temperature source Oral, resp. rate 16, height 5\' 9"  (1.753 m), weight 190 lb (86.2 kg), SpO2 97%. Physical Exam Vitals reviewed.  Constitutional:      Appearance: Normal appearance.  HENT:     Head: Normocephalic and atraumatic.  Eyes:     Extraocular Movements: Extraocular movements intact.     Pupils: Pupils are equal, round, and  reactive to light.  Cardiovascular:     Rate and Rhythm: Normal rate and regular rhythm.  Pulmonary:     Effort: Pulmonary effort is normal.     Breath sounds: Normal breath sounds.  Abdominal:     Comments: Abdomen soft, nondistended, no percussion tenderness, nontender to palpation; no rigidity, guarding, rebound tenderness; soft and reducible right inguinal hernia  Musculoskeletal:        General: Normal range of motion.     Cervical back: Normal range of motion.  Skin:    General: Skin is warm and dry.  Neurological:     General: No focal deficit present.     Mental Status: He is alert and oriented to person, place, and time.  Psychiatric:        Mood and Affect: Mood normal.        Behavior: Behavior normal.     Results: No results found for this or any previous visit (from the past 48 hour(s)).  No results found.   Assessment & Plan:  Dwayne [name] is a 77 y.o. male who presents with a right inguinal hernia  -We discussed the pathophysiology of hernias, and why we recommend surgical repair.  Given his abdominal surgical history, recommending open inguinal hernia repair -The risk and benefits of open right inguinal hernia repair with mesh were discussed including but not limited to bleeding, infection, injury to surrounding structures, need for additional procedures, and hernia recurrence.  After careful consideration, Dwayne [name] has decided to proceed with surgery.  -Patient tentatively scheduled for surgery on 8/26 -Information provided to the patient regarding inguinal hernias -Advised that if he begins to have a painful nonreducible bulge with nausea, vomiting, and obstipation, he needs to present to the emergency department for evaluation  All questions were answered to the satisfaction of the patient and family.  Theophilus Kinds, DO [facility] [address] [place], [place] [address] [phone] (office)

## 2023-01-08 ENCOUNTER — Encounter: Payer: Self-pay | Admitting: Nurse Practitioner

## 2023-01-08 ENCOUNTER — Ambulatory Visit: Payer: PPO | Admitting: Nurse Practitioner

## 2023-01-08 VITALS — BP 144/67 | HR 60 | Temp 97.3°F | Resp 20 | Ht 69.0 in | Wt 188.0 lb

## 2023-01-08 DIAGNOSIS — Z125 Encounter for screening for malignant neoplasm of prostate: Secondary | ICD-10-CM

## 2023-01-08 DIAGNOSIS — H938X2 Other specified disorders of left ear: Secondary | ICD-10-CM

## 2023-01-08 DIAGNOSIS — K219 Gastro-esophageal reflux disease without esophagitis: Secondary | ICD-10-CM

## 2023-01-08 DIAGNOSIS — I10 Essential (primary) hypertension: Secondary | ICD-10-CM

## 2023-01-08 MED ORDER — LISINOPRIL 10 MG PO TABS: ORAL_TABLET | ORAL | 1 refills | Status: DC

## 2023-01-08 MED ORDER — OMEPRAZOLE 20 MG PO CPDR: 20.0000 mg | DELAYED_RELEASE_CAPSULE | Freq: Every day | ORAL | 1 refills | Status: DC

## 2023-01-08 NOTE — Progress Notes (Signed)
Subjective:    Patient ID: Dwayne [name], male    DOB: Sep 21, 1945, 77 y.o.   MRN: [id number]   Chief Complaint: medical management of chronic issues     HPI:  Dwayne [name] is a 77 y.o. who identifies as a male who was assigned male at birth.   Social history: Lives with: wife Work history: retired   Water engineer in today for follow up of the following chronic medical issues:  1. Primary hypertension No c/o chest pain, sob or headache. Does  check blood pressure at home. Runs around 140 systolic BP Readings from Last 3 Encounters:  12/04/22 (!) 161/71  11/13/22 (!) 140/69  07/21/22 134/76     2. Gastroesophageal reflux disease without esophagitis Takes omperazole daily and is doing well.   New complaints: Had vent tubes placed in bil TM and has had some drainage from bil ears.  No Known Allergies Outpatient Encounter Medications as of 01/08/2023  Medication Sig   aspirin EC 81 MG tablet Take 81 mg by mouth daily.   fluticasone (FLONASE) 50 MCG/ACT nasal spray USE 1 SPRAY IN EACH NOSTRIL TWICE DAILY AS NEEDED   lisinopril (ZESTRIL) 10 MG tablet TAKE ONE (1) TABLET BY MOUTH EVERY DAY (Patient taking differently: Take 5 mg by mouth daily.)   Multiple Vitamin (MULTIVITAMIN) tablet Take 1 tablet by mouth daily.   omeprazole (PRILOSEC) 20 MG capsule TAKE ONE CAPSULE BY MOUTH DAILY   No facility-administered encounter medications on file as of 01/08/2023.    Past Surgical History:  Procedure Laterality Date   CHOLECYSTECTOMY     OTHER SURGICAL HISTORY     right shoulder surgery    OTHER SURGICAL HISTORY     right knee arthroscopic surgery    OTHER SURGICAL HISTORY     neck surgery    ROBOT ASSISTED LAPAROSCOPIC RADICAL PROSTATECTOMY  07/16/2011   Procedure: ROBOTIC ASSISTED LAPAROSCOPIC RADICAL PROSTATECTOMY;  Surgeon: Valetta Fuller, MD;  Location: [facility];  Service: Urology;  Laterality: N/A;  Robtic Assisted Laproscopic Radical Retropubic Prostatetomy      Family  History  Problem Relation Age of Onset   Heart attack Father    Cancer Brother    Leukemia Brother       Controlled substance contract: n/a     Review of Systems  Constitutional:  Negative for diaphoresis.  Eyes:  Negative for pain.  Respiratory:  Negative for shortness of breath.   Cardiovascular:  Negative for chest pain, palpitations and leg swelling.  Gastrointestinal:  Negative for abdominal pain.  Endocrine: Negative for polydipsia.  Skin:  Negative for rash.  Neurological:  Negative for dizziness, weakness and headaches.  Hematological:  Does not bruise/bleed easily.  All other systems reviewed and are negative.      Objective:   Physical Exam Vitals and nursing note reviewed.  Constitutional:      Appearance: Normal appearance. He is well-developed.  HENT:     Head: Normocephalic.     Nose: Nose normal.     Mouth/Throat:     Mouth: Mucous membranes are moist.     Pharynx: Oropharynx is clear.  Eyes:     Pupils: Pupils are equal, round, and reactive to light.  Neck:     Thyroid: No thyroid mass or thyromegaly.     Vascular: No carotid bruit or JVD.     Trachea: Phonation normal.  Cardiovascular:     Rate and Rhythm: Normal rate and regular rhythm.  Pulmonary:  Effort: Pulmonary effort is normal. No respiratory distress.     Breath sounds: Normal breath sounds.  Abdominal:     General: Bowel sounds are normal.     Palpations: Abdomen is soft.     Tenderness: There is no abdominal tenderness.  Musculoskeletal:        General: Normal range of motion.     Cervical back: Normal range of motion and neck supple.  Lymphadenopathy:     Cervical: No cervical adenopathy.  Skin:    General: Skin is warm and dry.  Neurological:     Mental Status: He is alert and oriented to person, place, and time.  Psychiatric:        Behavior: Behavior normal.        Thought Content: Thought content normal.        Judgment: Judgment normal.     BP (!) 144/67    Pulse 60   Temp (!) 97.3 F (36.3 C) (Temporal)   Resp 20   Ht 5\' 9"  (1.753 m)   Wt 188 lb (85.3 kg)   SpO2 97%   BMI 27.76 kg/m        Assessment & Plan:  Dwayne [name] comes in today with chief complaint of Medical Management of Chronic Issues   Diagnosis and orders addressed:  1. Primary hypertension Low sodium diet - CBC with Differential/Platelet - CMP14+EGFR - Lipid panel - lisinopril (ZESTRIL) 10 MG tablet; TAKE ONE (1) TABLET BY MOUTH EVERY DAY  Dispense: 90 tablet; Refill: 1  2. Gastroesophageal reflux disease without esophagitis Avoid spicy foods Do not eat 2 hours prior to bedtime  - omeprazole (PRILOSEC) 20 MG capsule; Take 1 capsule (20 mg total) by mouth daily.  Dispense: 90 capsule; Refill: 1  3. Prostate cancer screening Labs pending - PSA, total and free  4. Congestion of left ear No infection   Labs pending Health Maintenance reviewed Diet and exercise encouraged  Follow up plan: 6 months   Mary-Margaret Daphine Deutscher, FNP

## 2023-01-09 LAB — CBC WITH DIFFERENTIAL/PLATELET
Basophils Absolute: 0.1 10*3/uL (ref 0.0–0.2)
Basos: 1 %
EOS (ABSOLUTE): 0.1 10*3/uL (ref 0.0–0.4)
Eos: 3 %
Hematocrit: 44.2 % (ref 37.5–51.0)
Hemoglobin: 14.9 g/dL (ref 13.0–17.7)
Immature Grans (Abs): 0 10*3/uL (ref 0.0–0.1)
Immature Granulocytes: 0 %
Lymphocytes Absolute: 2 10*3/uL (ref 0.7–3.1)
Lymphs: 37 %
MCH: 30 pg (ref 26.6–33.0)
MCHC: 33.7 g/dL (ref 31.5–35.7)
MCV: 89 fL (ref 79–97)
Monocytes Absolute: 0.5 10*3/uL (ref 0.1–0.9)
Monocytes: 8 %
Neutrophils Absolute: 2.7 10*3/uL (ref 1.4–7.0)
Neutrophils: 51 %
Platelets: 184 10*3/uL (ref 150–450)
RBC: 4.96 x10E6/uL (ref 4.14–5.80)
RDW: 12.9 % (ref 11.6–15.4)
WBC: 5.4 10*3/uL (ref 3.4–10.8)

## 2023-01-09 LAB — CMP14+EGFR
ALT: 17 IU/L (ref 0–44)
AST: 21 IU/L (ref 0–40)
Albumin: 4.3 g/dL (ref 3.8–4.8)
Alkaline Phosphatase: 66 IU/L (ref 44–121)
BUN/Creatinine Ratio: 22 (ref 10–24)
BUN: 22 mg/dL (ref 8–27)
Bilirubin Total: 0.8 mg/dL (ref 0.0–1.2)
CO2: 23 mmol/L (ref 20–29)
Calcium: 9.3 mg/dL (ref 8.6–10.2)
Chloride: 105 mmol/L (ref 96–106)
Creatinine, Ser: 0.99 mg/dL (ref 0.76–1.27)
Globulin, Total: 1.9 g/dL (ref 1.5–4.5)
Glucose: 84 mg/dL (ref 70–99)
Potassium: 4.8 mmol/L (ref 3.5–5.2)
Sodium: 142 mmol/L (ref 134–144)
Total Protein: 6.2 g/dL (ref 6.0–8.5)
eGFR: 78 mL/min/{1.73_m2} (ref >59)

## 2023-01-09 LAB — LIPID PANEL
Chol/HDL Ratio: 3.6 ratio (ref 0.0–5.0)
Cholesterol, Total: 178 mg/dL (ref 100–199)
HDL: 50 mg/dL (ref >39)
LDL Chol Calc (NIH): 112 mg/dL — ABNORMAL HIGH (ref 0–99)
Triglycerides: 85 mg/dL (ref 0–149)
VLDL Cholesterol Cal: 16 mg/dL (ref 5–40)

## 2023-01-09 LAB — PSA, TOTAL AND FREE
PSA, Free: <0.02 ng/mL
Prostate Specific Ag, Serum: <0.1 ng/mL (ref 0.0–4.0)

## 2023-01-13 NOTE — Patient Instructions (Signed)
Dwayne [name]  01/13/2023     @PREFPERIOPPHARMACY @   Your procedure is scheduled on  01/19/2023.   Report to [facility] at  0600  A.M.   Call this number if you have problems the morning of surgery:  [phone]  If you experience any cold or flu symptoms such as cough, fever, chills, shortness of breath, etc. between now and your scheduled surgery, please notify us at the above number.   Remember:  Do not eat or drink after midnight.      Take these medicines the morning of surgery with A SIP OF WATER                                      omeprazole.    Do not wear jewelry, make-up or nail polish, including gel polish,  artificial nails, or any other type of covering on natural nails (fingers and  toes).  Do not wear lotions, powders, or perfumes, or deodorant.  Do not shave 48 hours prior to surgery.  Men may shave face and neck.  Do not bring valuables to the hospital.  [facility] is not responsible for any belongings or valuables.  Contacts, dentures or bridgework may not be worn into surgery.  Leave your suitcase in the car.  After surgery it may be brought to your room.  For patients admitted to the hospital, discharge time will be determined by your treatment team.  Patients discharged the day of surgery will not be allowed to drive home and must have someone with them for 24 hours.    Special instructions:   DO NOT smoke tobacco or vape for 24 hours before your procedure.  Please read over the following fact sheets that you were given. Coughing and Deep Breathing, Surgical Site Infection Prevention, Anesthesia Post-op Instructions, and Care and Recovery After Surgery      Open Hernia Repair, Adult, Care After What can I expect after the procedure? After the procedure, it is common to have: Mild discomfort. Slight bruising. Mild swelling. Pain in the belly (abdomen). A small amount of blood from the cut from surgery (incision). Follow these  instructions at home: Your doctor may give you more specific instructions. If you have problems, call your doctor. Medicines Take over-the-counter and prescription medicines only as told by your doctor. If told, take steps to prevent problems with pooping (constipation). You may need to: Drink enough fluid to keep your pee (urine) pale yellow. Take medicines. You will be told what medicines to take. Eat foods that are high in fiber. These include beans, whole grains, and fresh fruits and vegetables. Limit foods that are high in fat and sugar. These include fried or sweet foods. Ask your doctor if you should avoid driving or using machines while you are taking your medicine. Incision care  Follow instructions from your doctor about how to take care of your incision. Make sure you: Wash your hands with soap and water for at least 20 seconds before and after you change your bandage (dressing). If you cannot use soap and water, use hand sanitizer. Change your bandage. Leave stitches or skin glue in place for at least 2 weeks. Leave tape strips alone unless you are told to take them off. You may trim the edges of the tape strips if they curl up. Check your incision every day for signs  of infection. Check for: More redness, swelling, or pain. More fluid or blood. Warmth. Pus or a bad smell. Wear loose, soft clothing while your incision heals. Activity  Rest as told by your doctor. Do not lift anything that is heavier than 10 lb (4.5 kg), or the limit that you are told. Do not play contact sports until your doctor says that this is safe. If you were given a sedative during your procedure, do not drive or use machines until your doctor says that it is safe. A sedative is a medicine that helps you relax. Return to your normal activities when your doctor says that it is safe. General instructions Do not take baths, swim, or use a hot tub. Ask your doctor about taking showers or sponge  baths. Hold a pillow over your belly when you cough or sneeze. This helps with pain. Do not smoke or use any products that contain nicotine or tobacco. If you need help quitting, ask your doctor. Keep all follow-up visits. Contact a doctor if: You have any of these signs of infection in or around your incision: More redness, swelling, or pain. More fluid or blood. Warmth. Pus. A bad smell. You have a fever or chills. You have blood in your poop (stool). You have not pooped (had a bowel movement) in 2-3 days. Medicine does not help your pain. Get help right away if: You have chest pain, or you are short of breath. You feel faint or light-headed. You have very bad pain. You vomit and your pain is worse. You have pain, swelling, or redness in a leg. These symptoms may be an emergency. Get help right away. Call your local emergency services (911 in the [place] Do not wait to see if the symptoms will go away. Do not drive yourself to the hospital. Summary After this procedure, it is common to have mild discomfort, slight bruising, and mild swelling. Follow instructions from your doctor about how to take care of your cut from surgery (incision). Check every day for signs of infection. Do not lift heavy objects or play contact sports until your doctor says it is safe. Return to your normal activities as told by your doctor. This information is not intended to replace advice given to you by your health care provider. Make sure you discuss any questions you have with your health care provider. Document Revised: 12/26/2019 Document Reviewed: 12/26/2019 Elsevier Patient Education  2024 [employer] General Anesthesia, Adult, Care After The following information offers guidance on how to care for yourself after your procedure. Your health care provider may also give you more specific instructions. If you have problems or questions, contact your health care provider. What can I expect after the  procedure? After the procedure, it is common for people to: Have pain or discomfort at the IV site. Have nausea or vomiting. Have a sore throat or hoarseness. Have trouble concentrating. Feel cold or chills. Feel weak, sleepy, or tired (fatigue). Have soreness and body aches. These can affect parts of the body that were not involved in surgery. Follow these instructions at home: For the time period you were told by your health care provider:  Rest. Do not participate in activities where you could fall or become injured. Do not drive or use machinery. Do not drink alcohol. Do not take sleeping pills or medicines that cause drowsiness. Do not make important decisions or sign legal documents. Do not take care of children on your own. General instructions Drink enough fluid  to keep your urine pale yellow. If you have sleep apnea, surgery and certain medicines can increase your risk for breathing problems. Follow instructions from your health care provider about wearing your sleep device: Anytime you are sleeping, including during daytime naps. While taking prescription pain medicines, sleeping medicines, or medicines that make you drowsy. Return to your normal activities as told by your health care provider. Ask your health care provider what activities are safe for you. Take over-the-counter and prescription medicines only as told by your health care provider. Do not use any products that contain nicotine or tobacco. These products include cigarettes, chewing tobacco, and vaping devices, such as e-cigarettes. These can delay incision healing after surgery. If you need help quitting, ask your health care provider. Contact a health care provider if: You have nausea or vomiting that does not get better with medicine. You vomit every time you eat or drink. You have pain that does not get better with medicine. You cannot urinate or have bloody urine. You develop a skin rash. You have a  fever. Get help right away if: You have trouble breathing. You have chest pain. You vomit blood. These symptoms may be an emergency. Get help right away. Call 911. Do not wait to see if the symptoms will go away. Do not drive yourself to the hospital. Summary After the procedure, it is common to have a sore throat, hoarseness, nausea, vomiting, or to feel weak, sleepy, or fatigue. For the time period you were told by your health care provider, do not drive or use machinery. Get help right away if you have difficulty breathing, have chest pain, or vomit blood. These symptoms may be an emergency. This information is not intended to replace advice given to you by your health care provider. Make sure you discuss any questions you have with your health care provider. Document Revised: 08/09/2021 Document Reviewed: 08/09/2021 Elsevier Patient Education  2024 Else[employer] Inc. How to Use Chlorhexidine Before Surgery Chlorhexidine gluconate (CHG) is a germ-killing (antiseptic) solution that is used to clean the skin. It can get rid of the bacteria that normally live on the skin and can keep them away for about 24 hours. To clean your skin with CHG, you may be given: A CHG solution to use in the shower or as part of a sponge bath. A prepackaged cloth that contains CHG. Cleaning your skin with CHG may help lower the risk for infection: While you are staying in the intensive care unit of the hospital. If you have a vascular access, such as a central line, to provide short-term or long-term access to your veins. If you have a catheter to drain urine from your bladder. If you are on a ventilator. A ventilator is a machine that helps you breathe by moving air in and out of your lungs. After surgery. What are the risks? Risks of using CHG include: A skin reaction. Hearing loss, if CHG gets in your ears and you have a perforated eardrum. Eye injury, if CHG gets in your eyes and is not rinsed out. The CHG  product catching fire. Make sure that you avoid smoking and flames after applying CHG to your skin. Do not use CHG: If you have a chlorhexidine allergy or have previously reacted to chlorhexidine. On babies younger than 62 months of age. How to use CHG solution Use CHG only as told by your health care provider, and follow the instructions on the label. Use the full amount of CHG as directed.  Usually, this is one bottle. During a shower Follow these steps when using CHG solution during a shower (unless your health care provider gives you different instructions): Start the shower. Use your normal soap and shampoo to wash your face and hair. Turn off the shower or move out of the shower stream. Pour the CHG onto a clean washcloth. Do not use any type of brush or rough-edged sponge. Starting at your neck, lather your body down to your toes. Make sure you follow these instructions: If you will be having surgery, pay special attention to the part of your body where you will be having surgery. Scrub this area for at least 1 minute. Do not use CHG on your head or face. If the solution gets into your ears or eyes, rinse them well with water. Avoid your genital area. Avoid any areas of skin that have broken skin, cuts, or scrapes. Scrub your back and under your arms. Make sure to wash skin folds. Let the lather sit on your skin for 1-2 minutes or as long as told by your health care provider. Thoroughly rinse your entire body in the shower. Make sure that all body creases and crevices are rinsed well. Dry off with a clean towel. Do not put any substances on your body afterward--such as powder, lotion, or perfume--unless you are told to do so by your health care provider. Only use lotions that are recommended by the manufacturer. Put on clean clothes or pajamas. If it is the night before your surgery, sleep in clean sheets.  During a sponge bath Follow these steps when using CHG solution during a  sponge bath (unless your health care provider gives you different instructions): Use your normal soap and shampoo to wash your face and hair. Pour the CHG onto a clean washcloth. Starting at your neck, lather your body down to your toes. Make sure you follow these instructions: If you will be having surgery, pay special attention to the part of your body where you will be having surgery. Scrub this area for at least 1 minute. Do not use CHG on your head or face. If the solution gets into your ears or eyes, rinse them well with water. Avoid your genital area. Avoid any areas of skin that have broken skin, cuts, or scrapes. Scrub your back and under your arms. Make sure to wash skin folds. Let the lather sit on your skin for 1-2 minutes or as long as told by your health care provider. Using a different clean, wet washcloth, thoroughly rinse your entire body. Make sure that all body creases and crevices are rinsed well. Dry off with a clean towel. Do not put any substances on your body afterward--such as powder, lotion, or perfume--unless you are told to do so by your health care provider. Only use lotions that are recommended by the manufacturer. Put on clean clothes or pajamas. If it is the night before your surgery, sleep in clean sheets. How to use CHG prepackaged cloths Only use CHG cloths as told by your health care provider, and follow the instructions on the label. Use the CHG cloth on clean, dry skin. Do not use the CHG cloth on your head or face unless your health care provider tells you to. When washing with the CHG cloth: Avoid your genital area. Avoid any areas of skin that have broken skin, cuts, or scrapes. Before surgery Follow these steps when using a CHG cloth to clean before surgery (unless your health care provider  gives you different instructions): Using the CHG cloth, vigorously scrub the part of your body where you will be having surgery. Scrub using a back-and-forth motion  for 3 minutes. The area on your body should be completely wet with CHG when you are done scrubbing. Do not rinse. Discard the cloth and let the area air-dry. Do not put any substances on the area afterward, such as powder, lotion, or perfume. Put on clean clothes or pajamas. If it is the night before your surgery, sleep in clean sheets.  For general bathing Follow these steps when using CHG cloths for general bathing (unless your health care provider gives you different instructions). Use a separate CHG cloth for each area of your body. Make sure you wash between any folds of skin and between your fingers and toes. Wash your body in the following order, switching to a new cloth after each step: The front of your neck, shoulders, and chest. Both of your arms, under your arms, and your hands. Your stomach and groin area, avoiding the genitals. Your right leg and foot. Your left leg and foot. The back of your neck, your back, and your buttocks. Do not rinse. Discard the cloth and let the area air-dry. Do not put any substances on your body afterward--such as powder, lotion, or perfume--unless you are told to do so by your health care provider. Only use lotions that are recommended by the manufacturer. Put on clean clothes or pajamas. Contact a health care provider if: Your skin gets irritated after scrubbing. You have questions about using your solution or cloth. You swallow any chlorhexidine. Call your local poison control center (726-334-3674 in the [place] Get help right away if: Your eyes itch badly, or they become very red or swollen. Your skin itches badly and is red or swollen. Your hearing changes. You have trouble seeing. You have swelling or tingling in your mouth or throat. You have trouble breathing. These symptoms may represent a serious problem that is an emergency. Do not wait to see if the symptoms will go away. Get medical help right away. Call your local emergency services  (911 in the [place] Do not drive yourself to the hospital. Summary Chlorhexidine gluconate (CHG) is a germ-killing (antiseptic) solution that is used to clean the skin. Cleaning your skin with CHG may help to lower your risk for infection. You may be given CHG to use for bathing. It may be in a bottle or in a prepackaged cloth to use on your skin. Carefully follow your health care provider's instructions and the instructions on the product label. Do not use CHG if you have a chlorhexidine allergy. Contact your health care provider if your skin gets irritated after scrubbing. This information is not intended to replace advice given to you by your health care provider. Make sure you discuss any questions you have with your health care provider. Document Revised: 09/09/2021 Document Reviewed: 07/23/2020 Elsevier Patient Education  2023 [employer].

## 2023-01-15 ENCOUNTER — Encounter (HOSPITAL_COMMUNITY)
Admission: RE | Admit: 2023-01-15 | Discharge: 2023-01-15 | Disposition: A | Discharge status: Home / Self Care | Payer: PPO | Source: Ambulatory Visit | Attending: Surgery | Admitting: Surgery

## 2023-01-15 ENCOUNTER — Encounter (HOSPITAL_COMMUNITY): Payer: Self-pay

## 2023-01-15 ENCOUNTER — Other Ambulatory Visit: Payer: Self-pay

## 2023-01-15 VITALS — BP 144/67 | HR 60 | Temp 97.3°F | Resp 18 | Ht 69.0 in | Wt 188.0 lb

## 2023-01-15 DIAGNOSIS — Z01818 Encounter for other preprocedural examination: Secondary | ICD-10-CM | POA: Insufficient documentation

## 2023-01-15 DIAGNOSIS — N189 Chronic kidney disease, unspecified: Secondary | ICD-10-CM | POA: Insufficient documentation

## 2023-01-15 DIAGNOSIS — R9431 Abnormal electrocardiogram [ECG] [EKG]: Secondary | ICD-10-CM | POA: Insufficient documentation

## 2023-01-15 DIAGNOSIS — I10 Essential (primary) hypertension: Secondary | ICD-10-CM | POA: Insufficient documentation

## 2023-01-15 HISTORY — DX: Essential (primary) hypertension: I10

## 2023-01-19 ENCOUNTER — Encounter (HOSPITAL_COMMUNITY)
Admission: RE | Disposition: A | Discharge status: Home / Self Care | Payer: Self-pay | Source: Home / Self Care | Attending: Surgery

## 2023-01-19 ENCOUNTER — Encounter (HOSPITAL_COMMUNITY): Payer: Self-pay | Admitting: Surgery

## 2023-01-19 ENCOUNTER — Ambulatory Visit: Payer: PPO | Admitting: Nurse Practitioner

## 2023-01-19 ENCOUNTER — Ambulatory Visit (HOSPITAL_BASED_OUTPATIENT_CLINIC_OR_DEPARTMENT_OTHER): Payer: PPO | Admitting: Certified Registered Nurse Anesthetist

## 2023-01-19 ENCOUNTER — Ambulatory Visit (HOSPITAL_COMMUNITY)
Admission: RE | Admit: 2023-01-19 | Discharge: 2023-01-19 | Disposition: A | Discharge status: Home / Self Care | Payer: PPO | Attending: Surgery | Admitting: Surgery

## 2023-01-19 ENCOUNTER — Ambulatory Visit (HOSPITAL_COMMUNITY): Payer: PPO | Admitting: Certified Registered Nurse Anesthetist

## 2023-01-19 DIAGNOSIS — K4091 Unilateral inguinal hernia, without obstruction or gangrene, recurrent: Secondary | ICD-10-CM | POA: Diagnosis not present

## 2023-01-19 DIAGNOSIS — N189 Chronic kidney disease, unspecified: Secondary | ICD-10-CM | POA: Diagnosis not present

## 2023-01-19 DIAGNOSIS — Z9049 Acquired absence of other specified parts of digestive tract: Secondary | ICD-10-CM | POA: Diagnosis not present

## 2023-01-19 DIAGNOSIS — Z7982 Long term (current) use of aspirin: Secondary | ICD-10-CM | POA: Insufficient documentation

## 2023-01-19 DIAGNOSIS — I129 Hypertensive chronic kidney disease with stage 1 through stage 4 chronic kidney disease, or unspecified chronic kidney disease: Secondary | ICD-10-CM | POA: Diagnosis not present

## 2023-01-19 DIAGNOSIS — K4090 Unilateral inguinal hernia, without obstruction or gangrene, not specified as recurrent: Secondary | ICD-10-CM

## 2023-01-19 DIAGNOSIS — Z87891 Personal history of nicotine dependence: Secondary | ICD-10-CM | POA: Diagnosis not present

## 2023-01-19 DIAGNOSIS — K219 Gastro-esophageal reflux disease without esophagitis: Secondary | ICD-10-CM | POA: Diagnosis not present

## 2023-01-19 DIAGNOSIS — Z9079 Acquired absence of other genital organ(s): Secondary | ICD-10-CM | POA: Diagnosis not present

## 2023-01-19 DIAGNOSIS — Z8546 Personal history of malignant neoplasm of prostate: Secondary | ICD-10-CM | POA: Insufficient documentation

## 2023-01-19 HISTORY — PX: INGUINAL HERNIA REPAIR: SHX194

## 2023-01-19 SURGERY — REPAIR, HERNIA, INGUINAL, ADULT
Anesthesia: General | Laterality: Right

## 2023-01-19 MED ORDER — LACTATED RINGERS IV SOLN: INTRAVENOUS | Status: DC

## 2023-01-19 MED ORDER — PROPOFOL 10 MG/ML IV BOLUS
INTRAVENOUS | Status: AC
  Filled 2023-01-19: qty 20

## 2023-01-19 MED ORDER — SUGAMMADEX SODIUM 200 MG/2ML IV SOLN
INTRAVENOUS | Status: DC | PRN
  Administered 2023-01-19: 170.6 mg via INTRAVENOUS

## 2023-01-19 MED ORDER — CHLORHEXIDINE GLUCONATE CLOTH 2 % EX PADS: 6.0000 | MEDICATED_PAD | Freq: Once | CUTANEOUS | Status: DC

## 2023-01-19 MED ORDER — LIDOCAINE 2% (20 MG/ML) 5 ML SYRINGE
INTRAMUSCULAR | Status: DC | PRN
  Administered 2023-01-19: 60 mg via INTRAVENOUS

## 2023-01-19 MED ORDER — FENTANYL CITRATE (PF) 100 MCG/2ML IJ SOLN
INTRAMUSCULAR | Status: AC
  Filled 2023-01-19: qty 2

## 2023-01-19 MED ORDER — ORAL CARE MOUTH RINSE: 15.0000 mL | Freq: Once | OROMUCOSAL | Status: DC

## 2023-01-19 MED ORDER — ONDANSETRON HCL 4 MG/2ML IJ SOLN
INTRAMUSCULAR | Status: DC | PRN
  Administered 2023-01-19: 4 mg via INTRAVENOUS

## 2023-01-19 MED ORDER — OXYCODONE HCL 5 MG/5ML PO SOLN: 5.0000 mg | Freq: Once | ORAL | Status: DC | PRN

## 2023-01-19 MED ORDER — CEFAZOLIN SODIUM-DEXTROSE 2-4 GM/100ML-% IV SOLN
INTRAVENOUS | Status: AC
  Filled 2023-01-19: qty 100

## 2023-01-19 MED ORDER — LIDOCAINE HCL (PF) 2 % IJ SOLN
INTRAMUSCULAR | Status: AC
  Filled 2023-01-19: qty 5

## 2023-01-19 MED ORDER — PROPOFOL 10 MG/ML IV BOLUS
INTRAVENOUS | Status: DC | PRN
  Administered 2023-01-19: 120 mg via INTRAVENOUS

## 2023-01-19 MED ORDER — KETOROLAC TROMETHAMINE 30 MG/ML IJ SOLN
INTRAMUSCULAR | Status: AC
  Filled 2023-01-19: qty 1

## 2023-01-19 MED ORDER — SODIUM CHLORIDE 0.9 % IR SOLN
Status: DC | PRN
  Administered 2023-01-19: 500 mL

## 2023-01-19 MED ORDER — FENTANYL CITRATE PF 50 MCG/ML IJ SOSY: 25.0000 ug | PREFILLED_SYRINGE | INTRAMUSCULAR | Status: DC | PRN

## 2023-01-19 MED ORDER — CHLORHEXIDINE GLUCONATE 0.12 % MT SOLN: 15.0000 mL | Freq: Once | OROMUCOSAL | Status: DC

## 2023-01-19 MED ORDER — ACETAMINOPHEN 500 MG PO TABS: 1000.0000 mg | ORAL_TABLET | Freq: Four times a day (QID) | ORAL | 0 refills | Status: AC

## 2023-01-19 MED ORDER — ONDANSETRON HCL 4 MG/2ML IJ SOLN
INTRAMUSCULAR | Status: AC
  Filled 2023-01-19: qty 2

## 2023-01-19 MED ORDER — ROCURONIUM BROMIDE 10 MG/ML (PF) SYRINGE
PREFILLED_SYRINGE | INTRAVENOUS | Status: AC
  Filled 2023-01-19: qty 10

## 2023-01-19 MED ORDER — DOCUSATE SODIUM 100 MG PO CAPS: 100.0000 mg | ORAL_CAPSULE | Freq: Two times a day (BID) | ORAL | 2 refills | Status: DC

## 2023-01-19 MED ORDER — DEXAMETHASONE SODIUM PHOSPHATE 10 MG/ML IJ SOLN
INTRAMUSCULAR | Status: AC
  Filled 2023-01-19: qty 1

## 2023-01-19 MED ORDER — ONDANSETRON HCL 4 MG PO TABS: 4.0000 mg | ORAL_TABLET | Freq: Every day | ORAL | 1 refills | Status: DC | PRN

## 2023-01-19 MED ORDER — FENTANYL CITRATE (PF) 250 MCG/5ML IJ SOLN
INTRAMUSCULAR | Status: DC | PRN
  Administered 2023-01-19: 100 ug via INTRAVENOUS

## 2023-01-19 MED ORDER — CEFAZOLIN SODIUM-DEXTROSE 2-4 GM/100ML-% IV SOLN
2.0000 g | INTRAVENOUS | Status: AC
  Administered 2023-01-19: 2 g via INTRAVENOUS

## 2023-01-19 MED ORDER — ONDANSETRON HCL 4 MG/2ML IJ SOLN: 4.0000 mg | Freq: Once | INTRAMUSCULAR | Status: DC | PRN

## 2023-01-19 MED ORDER — DEXAMETHASONE SODIUM PHOSPHATE 10 MG/ML IJ SOLN
INTRAMUSCULAR | Status: DC | PRN
  Administered 2023-01-19: 5 mg via INTRAVENOUS

## 2023-01-19 MED ORDER — OXYCODONE HCL 5 MG PO TABS: 5.0000 mg | ORAL_TABLET | Freq: Four times a day (QID) | ORAL | 0 refills | Status: DC | PRN

## 2023-01-19 MED ORDER — OXYCODONE HCL 5 MG PO TABS: 5.0000 mg | ORAL_TABLET | Freq: Once | ORAL | Status: DC | PRN

## 2023-01-19 MED ORDER — KETOROLAC TROMETHAMINE 15 MG/ML IJ SOLN
INTRAMUSCULAR | Status: DC | PRN
Start: 2023-01-19 — End: 2023-01-19
  Administered 2023-01-19: 15 mg via INTRAVENOUS

## 2023-01-19 MED ORDER — ROCURONIUM BROMIDE 10 MG/ML (PF) SYRINGE
PREFILLED_SYRINGE | INTRAVENOUS | Status: DC | PRN
  Administered 2023-01-19: 10 mg via INTRAVENOUS
  Administered 2023-01-19: 70 mg via INTRAVENOUS

## 2023-01-19 MED ORDER — ASPIRIN EC 81 MG PO TBEC: 81.0000 mg | DELAYED_RELEASE_TABLET | Freq: Every day | ORAL | Status: DC

## 2023-01-19 MED ORDER — ACETAMINOPHEN 10 MG/ML IV SOLN
INTRAVENOUS | Status: AC
  Filled 2023-01-19: qty 100

## 2023-01-19 MED ORDER — ACETAMINOPHEN 10 MG/ML IV SOLN
INTRAVENOUS | Status: DC | PRN
  Administered 2023-01-19: 1000 mg via INTRAVENOUS

## 2023-01-19 MED ORDER — BUPIVACAINE HCL (PF) 0.5 % IJ SOLN
INTRAMUSCULAR | Status: AC
  Filled 2023-01-19: qty 30

## 2023-01-19 MED ORDER — BUPIVACAINE HCL (PF) 0.5 % IJ SOLN
INTRAMUSCULAR | Status: DC | PRN
  Administered 2023-01-19: 30 mL

## 2023-01-19 SURGICAL SUPPLY — 32 items
ADH SKN CLS APL DERMABOND .7 (GAUZE/BANDAGES/DRESSINGS) ×1
CLOTH BEACON ORANGE TIMEOUT ST (SAFETY) ×1 IMPLANT
COVER LIGHT HANDLE STERIS (MISCELLANEOUS) ×2 IMPLANT
DERMABOND ADVANCED .7 DNX12 (GAUZE/BANDAGES/DRESSINGS) ×1 IMPLANT
DRAIN PENROSE 0.5X18 (DRAIN) ×1 IMPLANT
ELECT REM PT RETURN 9FT ADLT (ELECTROSURGICAL) ×1
ELECTRODE REM PT RTRN 9FT ADLT (ELECTROSURGICAL) ×1 IMPLANT
GAUZE SPONGE 4X4 12PLY STRL (GAUZE/BANDAGES/DRESSINGS) ×1 IMPLANT
GLOVE BIOGEL PI IND STRL 6.5 (GLOVE) ×1 IMPLANT
GLOVE BIOGEL PI IND STRL 7.0 (GLOVE) ×2 IMPLANT
GLOVE SURG SS PI 6.5 STRL IVOR (GLOVE) ×2 IMPLANT
GOWN STRL REUS W/TWL LRG LVL3 (GOWN DISPOSABLE) ×3 IMPLANT
KIT TURNOVER KIT A (KITS) ×1 IMPLANT
MANIFOLD NEPTUNE II (INSTRUMENTS) ×1 IMPLANT
MESH PRE-SHAPE KEYHOLE 1.8X4 (Mesh General) IMPLANT
NS IRRIG 1000ML POUR BTL (IV SOLUTION) ×1 IMPLANT
PACK MINOR (CUSTOM PROCEDURE TRAY) ×1 IMPLANT
PAD ARMBOARD 7.5X6 YLW CONV (MISCELLANEOUS) ×1 IMPLANT
POSITIONER HEAD 8X9X4 ADT (SOFTGOODS) ×1 IMPLANT
SET BASIN LINEN APH (SET/KITS/TRAYS/PACK) ×1 IMPLANT
SOL PREP PROV IODINE SCRUB 4OZ (MISCELLANEOUS) ×1 IMPLANT
SPONGE INTESTINAL PEANUT (DISPOSABLE) IMPLANT
SUT MNCRL AB 4-0 PS2 18 (SUTURE) ×1 IMPLANT
SUT NOVA NAB GS-22 2 2-0 T-19 (SUTURE) IMPLANT
SUT SILK 2 0 FSL 18 (SUTURE) ×1 IMPLANT
SUT SILK 3 0 (SUTURE) ×1
SUT SILK 3-0 18XBRD TIE 12 (SUTURE) IMPLANT
SUT VIC AB 2-0 CT1 27 (SUTURE) ×1
SUT VIC AB 2-0 CT1 TAPERPNT 27 (SUTURE) ×1 IMPLANT
SUT VIC AB 3-0 SH 27 (SUTURE) ×2
SUT VIC AB 3-0 SH 27X BRD (SUTURE) ×1 IMPLANT
SUT VICRYL AB 3 0 TIES (SUTURE) IMPLANT

## 2023-01-19 NOTE — Progress Notes (Signed)
[facility]  Spoke with the patient's family in the consultation room.  They denied any knowledge of previous right groin surgeries.  I explained that he tolerated the procedure without difficulty.  He has dissolvable stitches under the skin with overlying skin glue.  This will flake off in 10 to 14 days.  I discharged him home with a prescription for narcotic pain medication that they should take as needed for pain.  I also want him taking scheduled Tylenol.  If they take the narcotic pain medication, they should take a stool softener as well.  The patient will follow-up with me in 2 weeks.  All questions were answered to their expressed satisfaction.  Dwayne Kinds, DO [facility] [address] [place], [place] [address] [phone] (office)

## 2023-01-19 NOTE — Transfer of Care (Signed)
Immediate Anesthesia Transfer of Care Note  Patient: Dwayne [name]  Procedure(s) Performed: HERNIA REPAIR INGUINAL ADULT W/ MESH (Right)  Patient Location: PACU  Anesthesia Type:General  Level of Consciousness: awake, alert , and oriented  Airway & Oxygen Therapy: Patient Spontanous Breathing and Patient connected to nasal cannula oxygen  Post-op Assessment: Report given to RN, Post -op Vital signs reviewed and stable, Patient moving all extremities X 4, and Patient able to stick tongue midline  Post vital signs: Reviewed and stable  Last Vitals:  Vitals Value Taken Time  BP 152/83 01/19/23 0931  Temp 36.4 C 01/19/23 0930  Pulse 69 01/19/23 0933  Resp 14 01/19/23 0932  SpO2 99 % 01/19/23 0933  Vitals shown include unfiled device data.  Last Pain:  Vitals:   01/19/23 0641  TempSrc: Oral  PainSc: 0-No pain      Patients Stated Pain Goal: 5 (01/19/23 0641)  Complications: No notable events documented.

## 2023-01-19 NOTE — Anesthesia Preprocedure Evaluation (Signed)
Anesthesia Evaluation  Patient identified by MRN, date of birth, ID band Patient awake    Reviewed: Allergy & Precautions, H&P , NPO status , Patient's Chart, lab work & pertinent test results, reviewed documented beta blocker date and time   History of Anesthesia Complications (+) PONV and history of anesthetic complications  Airway Mallampati: II  TM Distance: >3 FB Neck ROM: full    Dental no notable dental hx.    Pulmonary neg pulmonary ROS, former smoker   Pulmonary exam normal breath sounds clear to auscultation       Cardiovascular Exercise Tolerance: Good hypertension, negative cardio ROS  Rhythm:regular Rate:Normal     Neuro/Psych negative neurological ROS  negative psych ROS   GI/Hepatic negative GI ROS, Neg liver ROS,GERD  ,,  Endo/Other  negative endocrine ROS    Renal/GU Renal diseasenegative Renal ROS  negative genitourinary   Musculoskeletal   Abdominal   Peds  Hematology negative hematology ROS (+)   Anesthesia Other Findings   Reproductive/Obstetrics negative OB ROS                             Anesthesia Physical Anesthesia Plan  ASA: 2  Anesthesia Plan: General and General ETT   Post-op Pain Management:    Induction:   PONV Risk Score and Plan: Ondansetron  Airway Management Planned:   Additional Equipment:   Intra-op Plan:   Post-operative Plan:   Informed Consent: I have reviewed the patients History and Physical, chart, labs and discussed the procedure including the risks, benefits and alternatives for the proposed anesthesia with the patient or authorized representative who has indicated his/her understanding and acceptance.     Dental Advisory Given  Plan Discussed with: CRNA  Anesthesia Plan Comments:        Anesthesia Quick Evaluation

## 2023-01-19 NOTE — H&P (Signed)
[facility] History and Physical   Reason for Referral: Right inguinal hernia Referring Physician: Bennie Pierini, NP   Chief Complaint   New Patient (Initial Visit)        Dwayne [name] is a 77 y.o. male.  HPI: Patient presents for evaluation of a right inguinal hernia.  About 1-1/2 months ago he started having right groin pain.  He describes the pain as a stinging/burning sensation.  He first noticed this issue when he was fixing a pool pump.  He denies noting a significant bulge.  This feels similar to when he had a left inguinal hernia.  He denies any nausea or vomiting and is moving his bowels with his last bowel movement being this morning.  His past medical history significant for hypertension.  He takes an 81 mg aspirin daily.  His surgical history is significant for open left inguinal hernia repair 20 years ago, cholecystectomy, and radical prostatectomy.       Past Medical History:  Diagnosis Date   Cancer of prostate (HCC)     Cancer of skin     Chronic kidney disease      prostate cancer    GERD (gastroesophageal reflux disease)     PONV (postoperative nausea and vomiting)     Superficial basal cell carcinoma (BCC) 12/12/2020    Right Parotid Area               Past Surgical History:  Procedure Laterality Date   CHOLECYSTECTOMY       OTHER SURGICAL HISTORY        right shoulder surgery    OTHER SURGICAL HISTORY        right knee arthroscopic surgery    OTHER SURGICAL HISTORY        neck surgery    ROBOT ASSISTED LAPAROSCOPIC RADICAL PROSTATECTOMY   07/16/2011    Procedure: ROBOTIC ASSISTED LAPAROSCOPIC RADICAL PROSTATECTOMY;  Surgeon: Valetta Fuller, MD;  Location: [facility];  Service: Urology;  Laterality: N/A;  Robtic Assisted Laproscopic Radical Retropubic Prostatetomy                   Family History  Problem Relation Age of Onset   Heart attack Father     Cancer Brother     Leukemia Brother            Social History   Social History        Tobacco Use   Smoking status: Former   Smokeless tobacco: Former  Building services engineer status: Never Used  Substance Use Topics   Alcohol use: No   Drug use: No        Medications: I have reviewed the patient's current medications. Allergies as of 12/04/2022   No Known Allergies         Medication List           Accurate as of December 04, 2022 11:59 PM. If you have any questions, ask your nurse or doctor.              aspirin EC 81 MG tablet Take 81 mg by mouth daily.    fluticasone 50 MCG/ACT nasal spray Commonly known as: FLONASE USE 1 SPRAY IN EACH NOSTRIL TWICE DAILY AS NEEDED    lisinopril 10 MG tablet Commonly known as: ZESTRIL TAKE ONE (1) TABLET BY MOUTH EVERY DAY    multivitamin tablet Take 1 tablet by mouth daily.    omeprazole 20 MG capsule Commonly known  as: PRILOSEC TAKE ONE CAPSULE BY MOUTH DAILY               ROS:  Constitutional: negative for chills, fatigue, and fevers Eyes: negative for visual disturbance and pain Ears, nose, mouth, throat, and face: negative for ear drainage, sore throat, and sinus problems Respiratory: negative for cough, wheezing, and shortness of breath Cardiovascular: negative for chest pain and palpitations Gastrointestinal: negative for abdominal pain, nausea, reflux symptoms, and vomiting Genitourinary:negative for dysuria and frequency Integument/breast: negative for dryness and rash Hematologic/lymphatic: negative for bleeding and lymphadenopathy Musculoskeletal:negative for back pain and neck pain Neurological: negative for dizziness and tremors Endocrine: negative for temperature intolerance   Vitals:   01/19/23 0641  BP: (!) 183/96  Pulse: 79  Resp: 18  Temp: 98.4 F (36.9 C)  SpO2: 99%    Physical Exam Vitals reviewed.  Constitutional:      Appearance: Normal appearance.  HENT:     Head: Normocephalic and atraumatic.  Eyes:     Extraocular Movements: Extraocular  movements intact.     Pupils: Pupils are equal, round, and reactive to light.  Cardiovascular:     Rate and Rhythm: Normal rate and regular rhythm.  Pulmonary:     Effort: Pulmonary effort is normal.     Breath sounds: Normal breath sounds.  Abdominal:     Comments: Abdomen soft, nondistended, no percussion tenderness, nontender to palpation; no rigidity, guarding, rebound tenderness; soft and reducible right inguinal hernia  Musculoskeletal:        General: Normal range of motion.     Cervical back: Normal range of motion.  Skin:    General: Skin is warm and dry.  Neurological:     General: No focal deficit present.     Mental Status: He is alert and oriented to person, place, and time.  Psychiatric:        Mood and Affect: Mood normal.        Behavior: Behavior normal.        Results: Lab Results Last 48 Hours  No results found for this or any previous visit (from the past 48 hour(s)).     Imaging Results (Last 48 hours)  No results found.       Assessment & Plan:  [name] is a 77 y.o. male who presents with a right inguinal hernia   -We discussed the pathophysiology of hernias, and why we recommend surgical repair.  Given his abdominal surgical history, recommending open inguinal hernia repair -The risk and benefits of open right inguinal hernia repair with mesh were discussed including but not limited to bleeding, infection, injury to surrounding structures, need for additional procedures, and hernia recurrence.  After careful consideration, LATHANIEL BONFIELD has decided to proceed with surgery.  -Patient tentatively scheduled for surgery today  All questions were answered to the satisfaction of the patient and family.   Theophilus Kinds, DO [facility] [address] [place], [place] [address] [phone] (office)

## 2023-01-19 NOTE — Op Note (Signed)
[facility] Operative Note  01/19/23  Preoperative Diagnosis: right inguinal hernia    Postoperative Diagnosis: Recurrent right inguinal hernia   Procedure(s) Performed: Right inguinal hernia repair with mesh   Surgeon: Theophilus Kinds, DO    Assistants: Cecile Sheerer, RNFA   Anesthesia: General endotracheal   Anesthesiologist: Windell Norfolk, MD    Specimens: None   Estimated Blood Loss: Minimal   Blood Replacement: None    Complications: None   Wound Class: Clean   Operative Indications: Patient is a 77 year old male who presents for open right inguinal hernia repair.  He noted a bulge in his right groin about 1.5 months ago.  He is agreeable to surgical repair at this time.    All risks and benefits of performing this procedure were discussed with the patient including pain, infection, bleeding, damage to the surrounding structures, and need for more procedures or surgery. The patient voiced understanding of the procedure, all questions were sought and answered, and consent was obtained.  Findings:  -Scar noted in right groin that was not noted on previous exam- patient denied right inguinal hernia repair or other right groin surgery -Scarring noted along the external oblique aponeurosis at the external ring.  No evidence of previous mesh -Direct inguinal hernia sac noted with weakening of the floor, no indirect hernia sac noted -Mesh in place at the completion of surgery, not under tension -Hemostasis noted   Procedure: The patient was taken to the operating room and placed supine. General endotracheal anesthesia was induced. Intravenous antibiotics were administered per protocol.  A time out was preformed verifying the correct patient, procedure, site, positioning and implants.  The right groin and scrotum were prepared and draped in the usual sterile fashion.  A time-out was completed verifying correct patient, procedure, site, positioning, and  implant(s) and/or special equipment prior to beginning this procedure.  An incision was made in a natural skin crease between the pubic tubercle and the anterior superior iliac spine.  There was a previous scar in the right groin noted.  The incision was deepened with electrocautery through Scarpa's and Camper's fascia until the aponeurosis of the external oblique was encountered.  This was cleaned and the external ring was exposed.  An incision was made in the midportion of the external oblique aponeurosis in the direction of its fibers. The ilioinguinal nerve was not identified.  The external oblique aponeurosis was scarred to the external ring.  This was taken down with sharply with Metzenbaum scissors, taking care not to get into any of the structures of the spermatic cord.  Flaps of the external oblique were developed cephalad and inferiorly.    The cord was identified and it was gently dissented free at the pubic tubercle and encircled with a Penrose drain.  Attention was then directed at the anteromedial aspect of the cord, where an no indirect hernia sac was identified.  Attention was then turned to the floor of the canal, which was grossly weakened with a defined sac.  The hernia sac was sharply entered with Metzenbaum scissors, and it was only noted to contain fat.  A pursestring stitch was placed around the floor to help reduce herniating contents. The Bard Keyhole Mesh was sutured to the inguinal ligament inferiorly starting at the pubic tubercle using 2-0 Novafil interrupted sutures.  The mesh was sutured superiorly to the conjoint tendon using 2-0 Novafil interrupted sutures.  Care was taken to ensure the mesh was placed in a relaxed fashion to  avoid excessive tension and no neurovascular structures were caught in the repair.  Laterally the tails of the mesh were crossed and the internal ring was recreated, allowing for passage of cords without tension.   Hemostasis was adequate.  The Penrose was  removed.  The external oblique aponeurosis was closed with a 2-0 Vicryl suture in a running fashion, taking care to not catch the ilioinguinal nerve in the suture line.  Marcaine was instilled throughout the incision site.  Scarpa's fashion was closed with 3-0 Vicryl suture in a running fashion.  The dermis was closed with interrupted 3-0 Vicryl sutures. The skin was closed with a subcuticular 4-0 Monocryl suture.  Dermabond was applied.    The testis was gently pulled down into its anatomic position in the scrotum.  The patient tolerated the procedure well and was taken to the PACU in stable condition. All counts were correct at the end of the case.     Theophilus Kinds, DO  [facility] [address] [place], [place] [address] [phone] (office)

## 2023-01-19 NOTE — Discharge Instructions (Signed)
Ambulatory Surgery Discharge Instructions  General Anesthesia or Sedation Do not drive or operate heavy machinery for 24 hours.  Do not consume alcohol, tranquilizers, sleeping medications, or any non-prescribed medications for 24 hours. Do not make important decisions or sign any important papers in the next 24 hours. You should have someone with you tonight at home.  Activity  You are advised to go directly home from the hospital.  Restrict your activities and rest for a day.  Resume light activity tomorrow. No heavy lifting over 10 lbs or strenuous exercise.  Fluids and Diet Begin with clear liquids, bouillon, dry toast, soda crackers.  If not nauseated, you may go to a regular diet when you desire.  Greasy and spicy foods are not advised.  Medications  If you have not had a bowel movement in 24 hours, take 2 tablespoons over the counter Milk of mag.             You May resume your blood thinners tomorrow (Aspirin, coumadin, or other).  You are being discharged with prescriptions for Opioid/Narcotic Medications: There are some specific considerations for these medications that you should know. Opioid Meds have risks & benefits. Addiction to these meds is always a concern with prolonged use Take medication only as directed Do not drive while taking narcotic pain medication Do not crush tablets or capsules Do not use a different container than medication was dispensed in Lock the container of medication in a cool, dry place out of reach of children and pets. Opioid medication can cause addiction Do not share with anyone else (this is a felony) Do not store medications for future use. Dispose of them properly.     Disposal:  Find a [facility] household drug take back site near you.  If you can't get to a drug take back site, use the recipe below as a last resort to dispose of expired, unused or unwanted drugs. Disposal  (Do not dispose chemotherapy drugs this way, talk to your  prescribing doctor instead.) Step 1: Mix drugs (do not crush) with dirt, kitty litter, or used coffee grounds and add a small amount of water to dissolve any solid medications. Step 2: Seal drugs in plastic bag. Step 3: Place plastic bag in trash. Step 4: Take prescription container and scratch out personal information, then recycle or throw away.  Operative Site  You have a liquid bandage over your incisions, this will begin to flake off in about a week. Ok to shower tomorrow. Keep wound clean and dry. No baths or swimming. No lifting more than 10 pounds.  Contact Information: If you have questions or concerns, please call our office, [phone], Monday- Thursday 8AM-5PM and Friday 8AM-12Noon.  If it is after hours or on the weekend, please call [facility] Main Number, [phone], and ask to speak to the surgeon on call for Dr. Pappayliou at [facility].   SPECIFIC COMPLICATIONS TO WATCH FOR: Inability to urinate Fever over 101? F by mouth Nausea and vomiting lasting longer than 24 hours. Pain not relieved by medication ordered Swelling around the operative site Increased redness, warmth, hardness, around operative area Numbness, tingling, or cold fingers or toes Blood -soaked dressing, (small amounts of oozing may be normal) Increasing and progressive drainage from surgical area or exam site  

## 2023-01-19 NOTE — Anesthesia Procedure Notes (Signed)
Procedure Name: Intubation Date/Time: 01/19/2023 7:44 AM  Performed by: Cy Blamer, CRNAPre-anesthesia Checklist: Patient identified, Emergency Drugs available, Suction available and Patient being monitored Patient Re-evaluated:Patient Re-evaluated prior to induction Oxygen Delivery Method: Circle system utilized Preoxygenation: Pre-oxygenation with 100% oxygen Induction Type: IV induction Ventilation: Mask ventilation without difficulty Laryngoscope Size: Miller and 2 Grade View: Grade II Tube type: Oral Tube size: 7.5 mm Number of attempts: 1 Airway Equipment and Method: Stylet and Bite block Placement Confirmation: ETT inserted through vocal cords under direct vision, positive ETCO2 and breath sounds checked- equal and bilateral Secured at: 23 cm Tube secured with: Tape Dental Injury: Teeth and Oropharynx as per pre-operative assessment

## 2023-01-20 ENCOUNTER — Encounter (HOSPITAL_COMMUNITY): Payer: Self-pay | Admitting: Surgery

## 2023-01-20 NOTE — Anesthesia Postprocedure Evaluation (Signed)
Anesthesia Post Note  Patient: Dwayne [name]  Procedure(s) Performed: HERNIA REPAIR INGUINAL ADULT W/ MESH (Right)  Patient location during evaluation: Phase II Anesthesia Type: General Level of consciousness: awake Pain management: pain level controlled Vital Signs Assessment: post-procedure vital signs reviewed and stable Respiratory status: spontaneous breathing and respiratory function stable Cardiovascular status: blood pressure returned to baseline and stable Postop Assessment: no headache and no apparent nausea or vomiting Anesthetic complications: no Comments: Late entry   No notable events documented.   Last Vitals:  Vitals:   01/19/23 1006 01/19/23 1018  BP:  (!) 168/88  Pulse: 64 66  Resp: 16 18  Temp:  36.7 C  SpO2: 99% 99%    Last Pain:  Vitals:   01/19/23 1018  TempSrc: Oral  PainSc: 1                  Windell Norfolk

## 2023-02-03 DIAGNOSIS — R220 Localized swelling, mass and lump, head: Secondary | ICD-10-CM | POA: Diagnosis not present

## 2023-02-03 DIAGNOSIS — C61 Malignant neoplasm of prostate: Secondary | ICD-10-CM | POA: Diagnosis not present

## 2023-02-03 DIAGNOSIS — D352 Benign neoplasm of pituitary gland: Secondary | ICD-10-CM | POA: Diagnosis not present

## 2023-02-05 ENCOUNTER — Ambulatory Visit (INDEPENDENT_AMBULATORY_CARE_PROVIDER_SITE_OTHER): Payer: PPO | Admitting: Surgery

## 2023-02-05 ENCOUNTER — Encounter: Payer: Self-pay | Admitting: Surgery

## 2023-02-05 VITALS — BP 167/74 | HR 57 | Temp 97.7°F | Resp 14 | Ht 69.0 in | Wt 189.0 lb

## 2023-02-05 DIAGNOSIS — Z09 Encounter for follow-up examination after completed treatment for conditions other than malignant neoplasm: Secondary | ICD-10-CM

## 2023-02-05 NOTE — Progress Notes (Signed)
[facility] Note   HPI:  77 y.o. Male presents to clinic for post-op follow-up status post open right inguinal hernia repair with mesh on 8/26.  Patient has been doing well since the surgery.  He states that his pain is well-controlled, and he is not currently taking any pain medications.  He is tolerating a diet without nausea and vomiting, and moving his bowels without issue.  He has no significant complaints at this time.  Denies fevers and chills.  Review of Systems:  All other review of systems: otherwise negative   Vital Signs:  BP (!) 167/74   Pulse (!) 57   Temp 97.7 F (36.5 C) (Oral)   Resp 14   Ht 5\' 9"  (1.753 m)   Wt 189 lb (85.7 kg)   SpO2 97%   BMI 27.91 kg/m    Physical Exam:  Physical Exam Vitals reviewed.  Constitutional:      Appearance: Normal appearance.  Abdominal:     Comments: Abdomen soft, nondistended, no percussion tenderness, nontender to palpation; right inguinal incision healing well with Dermabond in place, mild incisional fullness without erythema or induration, incision nontender to palpation  Neurological:     Mental Status: He is alert.     Laboratory studies: None  Imaging:  None  Assessment:  77 y.o. yo Male who presents for follow-up status post open right inguinal hernia repair with mesh on 8/26  Plan:  -Patient overall doing very well since his surgery.  Tolerating a diet, moving his bowels, and pain well-controlled -Advised that he needs to take it easy for the next 2 to 4 weeks with continued no heavy lifting of greater than 10 pounds -Advised that he can use his riding lawnmower, but if he is having significant pain afterwards, he should back off on that activity -Follow up with me as needed  All of the above recommendations were discussed with the patient and patient's family, and all of patient's and family's questions were answered to their expressed satisfaction.  Theophilus Kinds, DO [facility] [address] [place], [place] [address] [phone] (office)

## 2023-02-10 ENCOUNTER — Ambulatory Visit: Payer: Self-pay

## 2023-02-10 NOTE — Progress Notes (Signed)
Patient regularly checks blood pressure at home.  Last home blood pressure reading was 132/70.

## 2023-02-12 DIAGNOSIS — Z6828 Body mass index (BMI) 28.0-28.9, adult: Secondary | ICD-10-CM | POA: Diagnosis not present

## 2023-02-12 DIAGNOSIS — D352 Benign neoplasm of pituitary gland: Secondary | ICD-10-CM | POA: Diagnosis not present

## 2023-02-24 DIAGNOSIS — M25561 Pain in right knee: Secondary | ICD-10-CM | POA: Diagnosis not present

## 2023-03-09 ENCOUNTER — Other Ambulatory Visit: Payer: Self-pay | Admitting: Family Medicine

## 2023-03-31 ENCOUNTER — Ambulatory Visit (INDEPENDENT_AMBULATORY_CARE_PROVIDER_SITE_OTHER): Payer: PPO

## 2023-03-31 ENCOUNTER — Encounter: Payer: Self-pay | Admitting: Nurse Practitioner

## 2023-03-31 ENCOUNTER — Ambulatory Visit (INDEPENDENT_AMBULATORY_CARE_PROVIDER_SITE_OTHER): Payer: PPO | Admitting: Nurse Practitioner

## 2023-03-31 VITALS — BP 124/62 | HR 69 | Temp 98.1°F | Resp 20 | Ht 69.0 in | Wt 192.0 lb

## 2023-03-31 DIAGNOSIS — I771 Stricture of artery: Secondary | ICD-10-CM | POA: Diagnosis not present

## 2023-03-31 DIAGNOSIS — I10 Essential (primary) hypertension: Secondary | ICD-10-CM | POA: Diagnosis not present

## 2023-03-31 DIAGNOSIS — Z01818 Encounter for other preprocedural examination: Secondary | ICD-10-CM

## 2023-03-31 DIAGNOSIS — I700 Atherosclerosis of aorta: Secondary | ICD-10-CM | POA: Diagnosis not present

## 2023-03-31 NOTE — Progress Notes (Signed)
Subjective:    Patient ID: Dwayne [name], male    DOB: 1945/11/06, 77 y.o.   MRN: [id number]   Chief Complaint: surgical clearance  HPI Patient is waiting to be scheduled for a total knee replacement. He is doing well today without complaints.   Patient Active Problem List   Diagnosis Date Noted   Right inguinal hernia 01/19/2023   Hearing loss of left ear 12/25/2020   Gastroesophageal reflux disease without esophagitis 05/22/2020   Hypertension 07/26/2019       Review of Systems  Constitutional:  Negative for diaphoresis.  Eyes:  Negative for pain.  Respiratory:  Negative for shortness of breath.   Cardiovascular:  Negative for chest pain, palpitations and leg swelling.  Gastrointestinal:  Negative for abdominal pain.  Endocrine: Negative for polydipsia.  Skin:  Negative for rash.  Neurological:  Negative for dizziness, weakness and headaches.  Hematological:  Does not bruise/bleed easily.  All other systems reviewed and are negative.      Objective:   Physical Exam Vitals and nursing note reviewed.  Constitutional:      Appearance: Normal appearance. He is well-developed.  HENT:     Head: Normocephalic.     Nose: Nose normal.     Mouth/Throat:     Mouth: Mucous membranes are moist.     Pharynx: Oropharynx is clear.  Eyes:     Pupils: Pupils are equal, round, and reactive to light.  Neck:     Thyroid: No thyroid mass or thyromegaly.     Vascular: No carotid bruit or JVD.     Trachea: Phonation normal.  Cardiovascular:     Rate and Rhythm: Normal rate and regular rhythm.  Pulmonary:     Effort: Pulmonary effort is normal. No respiratory distress.     Breath sounds: Normal breath sounds.  Abdominal:     General: Bowel sounds are normal.     Palpations: Abdomen is soft.     Tenderness: There is no abdominal tenderness.  Musculoskeletal:        General: Normal range of motion.     Cervical back: Normal range of motion and neck supple.  Lymphadenopathy:      Cervical: No cervical adenopathy.  Skin:    General: Skin is warm and dry.  Neurological:     Mental Status: He is alert and oriented to person, place, and time.  Psychiatric:        Behavior: Behavior normal.        Thought Content: Thought content normal.        Judgment: Judgment normal.    BP 124/62   Pulse 69   Temp 98.1 F (36.7 C) (Temporal)   Resp 20   Ht 5\' 9"  (1.753 m)   Wt 192 lb (87.1 kg)   SpO2 96%   BMI 28.35 kg/m    EKG reviewed- NSR -Mary-Margaret Daphine Deutscher, FNP Chest xray normal-Preliminary reading by Paulene Floor, FNP  [facility]      Assessment & Plan:   Dwayne [name] in today with chief complaint of surgical clearance  1. Preoperative clearance Cleared to be scheduled for surgery. - DG Chest 2 View    The above assessment and management plan was discussed with the patient. The patient verbalized understanding of and has agreed to the management plan. Patient is aware to call the clinic if symptoms persist or worsen. Patient is aware when to return to the clinic for a follow-up visit. Patient educated on when it is  appropriate to go to the emergency department.   Mary-Margaret Daphine Deutscher, FNP

## 2023-04-16 ENCOUNTER — Ambulatory Visit: Payer: Self-pay | Admitting: Orthopedic Surgery

## 2023-04-27 NOTE — Patient Instructions (Signed)
SURGICAL WAITING ROOM VISITATION Patients having surgery or a procedure may have no more than 2 support people in the waiting area - these visitors may rotate in the visitor waiting room.   Due to an increase in RSV and influenza rates and associated hospitalizations, children ages 66 and under may not visit patients in [facility] hospitals. If the patient needs to stay at the hospital during part of their recovery, the visitor guidelines for inpatient rooms apply.  PRE-OP VISITATION  Pre-op nurse will coordinate an appropriate time for 1 support person to accompany the patient in pre-op.  This support person may not rotate.  This visitor will be contacted when the time is appropriate for the visitor to come back in the pre-op area.  Please refer to the [facility] website for the visitor guidelines for Inpatients (after your surgery is over and you are in a regular room).  You are not required to quarantine at this time prior to your surgery. However, you must do this: Hand Hygiene often Do NOT share personal items Notify your provider if you are in close contact with someone who has COVID or you develop fever 100.4 or greater, new onset of sneezing, cough, sore throat, shortness of breath or body aches.  If you test positive for Covid or have been in contact with anyone that has tested positive in the last 10 days please notify you surgeon.    Your procedure is scheduled on:  MONDAY  May 11, 2023  Report to [facility] Main Entrance: [name] entrance where the [employer] is available.   Report to admitting at: 09:30    AM  Call this number if you have any questions or problems the morning of surgery [phone]  Do not eat food after Midnight the night prior to your surgery/procedure.  After Midnight you may have the following liquids until  09:00  AM DAY OF SURGERY  Clear Liquid Diet Water Black Coffee (sugar ok, NO MILK/CREAM OR CREAMERS)  Tea (sugar ok, NO  MILK/CREAM OR CREAMERS) regular and decaf                             Plain Jell-O  with no fruit (NO RED)                                           Fruit ices (not with fruit pulp, NO RED)                                     Popsicles (NO RED)                                                                  Juice: NO CITRUS JUICES: only apple, WHITE grape, WHITE cranberry Sports drinks like Gatorade or Powerade (NO RED)                   The day of surgery:  Drink ONE (1) Pre-Surgery Clear Ensure at 09:00 AM the morning of surgery. Drink  in one sitting. Do not sip.  This drink was given to you during your hospital pre-op appointment visit. Nothing else to drink after completing the Pre-Surgery Clear Ensure  : No candy, chewing gum or throat lozenges.    FOLLOW ANY ADDITIONAL PRE OP INSTRUCTIONS YOU RECEIVED FROM YOUR SURGEON'S OFFICE!!!   Oral Hygiene is also important to reduce your risk of infection.        Remember - BRUSH YOUR TEETH THE MORNING OF SURGERY WITH YOUR REGULAR TOOTHPASTE  Do NOT smoke after Midnight the night before surgery.  STOP TAKING all Vitamins, Herbs and supplements 1 week before your surgery.   ASPIRIN- Stop taking 1 week before your surgery  Take ONLY these medicines the morning of surgery with A SIP OF WATER: omeprazole,  You may use your Flonase nasal spray if needed.                   You may not have any metal on your body including  jewelry, and body piercing  Do not wear lotions, powders,  cologne, or deodorant  Men may shave face and neck.  Contacts, Hearing Aids, dentures or bridgework may not be worn into surgery. DENTURES WILL BE REMOVED PRIOR TO SURGERY PLEASE DO NOT APPLY "Poly grip" OR ADHESIVES!!!  You may bring a small overnight bag with you on the day of surgery, only pack items that are not valuable. [facility] IS NOT RESPONSIBLE   FOR VALUABLES THAT ARE LOST OR STOLEN.   Do not bring your home medications to the hospital. The  Pharmacy will dispense medications listed on your medication list to you during your admission in the Hospital.  Special Instructions: Bring a copy of your healthcare power of attorney and living will documents the day of surgery, if you wish to have them scanned into your [place] [facility] Records- EPIC  Please read over the following fact sheets you were given: IF YOU HAVE QUESTIONS ABOUT YOUR PRE-OP INSTRUCTIONS, PLEASE CALL [phone]   Joyce Gross , RN    Pre-operative 5 CHG Bath Instructions   You can play a key role in reducing the risk of infection after surgery. Your skin needs to be as free of germs as possible. You can reduce the number of germs on your skin by washing with CHG (chlorhexidine gluconate) soap before surgery. CHG is an antiseptic soap that kills germs and continues to kill germs even after washing.   DO NOT use if you have an allergy to chlorhexidine/CHG or antibacterial soaps. If your skin becomes reddened or irritated, stop using the CHG and notify one of our RNs at [phone]  Please shower with the CHG soap starting 4 days before surgery using the following schedule: START SHOWERS ON   THURSDAY  May 07, 2023  Please keep in mind the following:  DO NOT shave, including legs and underarms, starting the day of your first shower.   You may shave your face at any point before/day of surgery.   Place clean sheets on your bed the day you start using CHG soap. Use a clean washcloth (not used since being washed) for each shower. DO NOT sleep with pets once you start using the CHG.   CHG Shower Instructions:  If you choose to wash your hair and private area, wash first with your normal shampoo/soap.  After you use shampoo/soap, rinse your hair and body thoroughly to remove shampoo/soap  residue.  Turn the water OFF and apply about 3 tablespoons (45 ml) of CHG soap to a CLEAN washcloth.  Apply CHG soap ONLY FROM YOUR NECK DOWN TO YOUR TOES (washing for 3-5 minutes)  DO NOT use CHG soap on face, private areas, open wounds, or sores.  Pay special attention to the area where your surgery is being performed.  If you are having back surgery, having someone wash your back for you may be helpful.  Wait 2 minutes after CHG soap is applied, then you may rinse off the CHG soap.  Pat dry with a clean towel  Put on clean clothes/pajamas   If you choose to wear lotion, please use ONLY the CHG-compatible lotions on the back of this paper.     Additional instructions for the day of surgery: DO NOT APPLY any lotions, deodorants, cologne, or perfumes.   Put on clean/comfortable clothes.  Brush your teeth.  Ask your nurse before applying any prescription medications to the skin.      CHG Compatible Lotions   Aveeno Moisturizing lotion  Cetaphil Moisturizing Cream  Cetaphil Moisturizing Lotion  Clairol Herbal Essence Moisturizing Lotion, Dry Skin  Clairol Herbal Essence Moisturizing Lotion, Extra Dry Skin  Clairol Herbal Essence Moisturizing Lotion, Normal Skin  Curel Age Defying Therapeutic Moisturizing Lotion with Alpha Hydroxy  Curel Extreme Care Body Lotion  Curel Soothing Hands Moisturizing Hand Lotion  Curel Therapeutic Moisturizing Cream, Fragrance-Free  Curel Therapeutic Moisturizing Lotion, Fragrance-Free  Curel Therapeutic Moisturizing Lotion, Original Formula  Eucerin Daily Replenishing Lotion  Eucerin Dry Skin Therapy Plus Alpha Hydroxy Crme  Eucerin Dry Skin Therapy Plus Alpha Hydroxy Lotion  Eucerin Original Crme  Eucerin Original Lotion  Eucerin Plus Crme Eucerin Plus Lotion  Eucerin TriLipid Replenishing Lotion  Keri Anti-Bacterial Hand Lotion  Keri Deep Conditioning Original Lotion Dry Skin Formula Softly Scented  Keri Deep Conditioning Original Lotion,  Fragrance Free Sensitive Skin Formula  Keri Lotion Fast Absorbing Fragrance Free Sensitive Skin Formula  Keri Lotion Fast Absorbing Softly Scented Dry Skin Formula  Keri Original Lotion  Keri Skin Renewal Lotion Keri Silky Smooth Lotion  Keri Silky Smooth Sensitive Skin Lotion  Nivea Body Creamy Conditioning Oil  Nivea Body Extra Enriched Lotion  Nivea Body Original Lotion  Nivea Body Sheer Moisturizing Lotion Nivea Crme  Nivea Skin Firming Lotion  NutraDerm 30 Skin Lotion  NutraDerm Skin Lotion  NutraDerm Therapeutic Skin Cream  NutraDerm Therapeutic Skin Lotion  ProShield Protective Hand Cream  Provon moisturizing lotion   FAILURE TO FOLLOW THESE INSTRUCTIONS MAY RESULT IN THE CANCELLATION OF YOUR SURGERY  PATIENT SIGNATURE_________________________________  NURSE SIGNATURE__________________________________  ________________________________________________________________________     [name]    An incentive spirometer is a tool that can help keep your lungs clear and active. This tool measures how well you are filling your lungs with each breath. Taking long deep  breaths may help reverse or decrease the chance of developing breathing (pulmonary) problems (especially infection) following: A long period of time when you are unable to move or be active. BEFORE THE PROCEDURE  If the spirometer includes an indicator to show your best effort, your nurse or respiratory therapist will set it to a desired goal. If possible, sit up straight or lean slightly forward. Try not to slouch. Hold the incentive spirometer in an upright position. INSTRUCTIONS FOR USE  Sit on the edge of your bed if possible, or sit up as far as you can in bed or on a chair. Hold the incentive spirometer in an upright position. Breathe out normally. Place the mouthpiece in your mouth and seal your lips tightly around it. Breathe in slowly and as deeply as possible, raising the piston or the  ball toward the top of the column. Hold your breath for 3-5 seconds or for as long as possible. Allow the piston or ball to fall to the bottom of the column. Remove the mouthpiece from your mouth and breathe out normally. Rest for a few seconds and repeat Steps 1 through 7 at least 10 times every 1-2 hours when you are awake. Take your time and take a few normal breaths between deep breaths. The spirometer may include an indicator to show your best effort. Use the indicator as a goal to work toward during each repetition. After each set of 10 deep breaths, practice coughing to be sure your lungs are clear. If you have an incision (the cut made at the time of surgery), support your incision when coughing by placing a pillow or rolled up towels firmly against it. Once you are able to get out of bed, walk around indoors and cough well. You may stop using the incentive spirometer when instructed by your caregiver.  RISKS AND COMPLICATIONS Take your time so you do not get dizzy or light-headed. If you are in pain, you may need to take or ask for pain medication before doing incentive spirometry. It is harder to take a deep breath if you are having pain. AFTER USE Rest and breathe slowly and easily. It can be helpful to keep track of a log of your progress. Your caregiver can provide you with a simple table to help with this. If you are using the spirometer at home, follow these instructions: SEEK MEDICAL CARE IF:  You are having difficultly using the spirometer. You have trouble using the spirometer as often as instructed. Your pain medication is not giving enough relief while using the spirometer. You develop fever of 100.5 F (38.1 C) or higher.                                                                                                    SEEK IMMEDIATE MEDICAL CARE IF:  You cough up bloody sputum that had not been present before. You develop fever of 102 F (38.9 C) or greater. You develop  worsening pain at or near the incision site. MAKE SURE YOU:  Understand these instructions. Will watch your condition. Will get  help right away if you are not doing well or get worse. Document Released: 09/22/2006 Document Revised: 08/04/2011 Document Reviewed: 11/23/2006 [facility] Patient Information 2014 [place], [place].

## 2023-04-27 NOTE — Progress Notes (Signed)
COVID Vaccine received:  []  No [x]  Yes Date of any COVID positive Test in last 90 days:  PCP - Mary-Margaret Sallyanne Havers, FNP clearance in 03-31-23 Epic note Cardiologist - none  Chest x-ray - 03-31-2023  2v  Epic EKG -  01-15-2023  Epic Stress Test -  ECHO -  Cardiac Cath -   PCR screen: [x]  Ordered & Completed []   No Order but Needs PROFEND     []   N/A for this surgery  Surgery Plan:  []  Ambulatory   [x]  Outpatient in bed  []  Admit Anesthesia:    []  General  []  Spinal  [x]   Choice []   MAC  Pacemaker / ICD device [x]  No []  Yes   Spinal Cord Stimulator:[x]  No []  Yes       History of Sleep Apnea? [x]  No []  Yes   CPAP used?- [x]  No []  Yes    Does the patient monitor blood sugar?   [x]  N/A   []  No []  Yes  Patient has: [x]  NO Hx DM   []  Pre-DM   []  DM1  []   DM2  Blood Thinner / Instructions:   none Aspirin Instructions:  ASA 81 mg  ERAS Protocol Ordered: []  No  [x]  Yes PRE-SURGERY [x]  ENSURE  []  G2   Patient is to be NPO after: 0900  Dental hx: []  Dentures:  []  N/A      []  Bridge or Partial:                   []  Loose or Damaged teeth:   Comments: Patient was given the 5 CHG shower / bath instructions for TKA surgery along with 2 bottles of the CHG soap. Patient will start this on: Thursday 05-07-2023   All questions were asked and answered, Patient voiced understanding of this process.   Activity level: Patient is able / unable to climb a flight of stairs without difficulty; []  No CP  []  No SOB, but would have ___   Patient can / can not perform ADLs without assistance.   Anesthesia review: HTN, GERD, Hearing loss- left ear, PONV  Patient denies shortness of breath, fever, cough and chest pain at PAT appointment.  Patient verbalized understanding and agreement to the Pre-Surgical Instructions that were given to them at this PAT appointment. Patient was also educated of the need to review these PAT instructions again prior to his surgery.I reviewed the appropriate phone numbers to  call if they have any and questions or concerns.

## 2023-04-28 ENCOUNTER — Encounter (HOSPITAL_COMMUNITY): Payer: Self-pay

## 2023-04-28 ENCOUNTER — Other Ambulatory Visit: Payer: Self-pay

## 2023-04-28 ENCOUNTER — Encounter (HOSPITAL_COMMUNITY)
Admission: RE | Admit: 2023-04-28 | Discharge: 2023-04-28 | Disposition: A | Discharge status: Home / Self Care | Payer: PPO | Source: Ambulatory Visit | Attending: Specialist | Admitting: Specialist

## 2023-04-28 VITALS — BP 155/82 | HR 66 | Temp 98.4°F | Resp 16 | Ht 68.5 in | Wt 189.0 lb

## 2023-04-28 DIAGNOSIS — I10 Essential (primary) hypertension: Secondary | ICD-10-CM | POA: Insufficient documentation

## 2023-04-28 DIAGNOSIS — Z01818 Encounter for other preprocedural examination: Secondary | ICD-10-CM

## 2023-04-28 DIAGNOSIS — Z01812 Encounter for preprocedural laboratory examination: Secondary | ICD-10-CM | POA: Diagnosis not present

## 2023-04-28 HISTORY — DX: Unspecified osteoarthritis, unspecified site: M19.90

## 2023-04-28 HISTORY — DX: Benign neoplasm of pituitary gland: D35.2

## 2023-04-28 LAB — BASIC METABOLIC PANEL
Anion gap: 8 (ref 5–15)
BUN: 20 mg/dL (ref 8–23)
CO2: 24 mmol/L (ref 22–32)
Calcium: 9.4 mg/dL (ref 8.9–10.3)
Chloride: 108 mmol/L (ref 98–111)
Creatinine, Ser: 0.84 mg/dL (ref 0.61–1.24)
GFR, Estimated: >60 mL/min (ref >60)
Glucose, Bld: 89 mg/dL (ref 70–99)
Potassium: 4.2 mmol/L (ref 3.5–5.1)
Sodium: 140 mmol/L (ref 135–145)

## 2023-04-28 LAB — CBC
HCT: 44.2 % (ref 39.0–52.0)
Hemoglobin: 15.1 g/dL (ref 13.0–17.0)
MCH: 30.4 pg (ref 26.0–34.0)
MCHC: 34.2 g/dL (ref 30.0–36.0)
MCV: 88.9 fL (ref 80.0–100.0)
Platelets: 190 10*3/uL (ref 150–400)
RBC: 4.97 MIL/uL (ref 4.22–5.81)
RDW: 12.8 % (ref 11.5–15.5)
WBC: 6.3 10*3/uL (ref 4.0–10.5)
nRBC: 0 % (ref 0.0–0.2)

## 2023-04-28 LAB — SURGICAL PCR SCREEN
MRSA, PCR: NEGATIVE
Staphylococcus aureus: NEGATIVE

## 2023-05-08 ENCOUNTER — Ambulatory Visit: Payer: Self-pay | Admitting: Orthopedic Surgery

## 2023-05-08 DIAGNOSIS — M25561 Pain in right knee: Secondary | ICD-10-CM | POA: Diagnosis not present

## 2023-05-08 DIAGNOSIS — M1711 Unilateral primary osteoarthritis, right knee: Secondary | ICD-10-CM | POA: Diagnosis not present

## 2023-05-08 NOTE — H&P (View-Only) (Signed)
CALEEL JUNGERS is an 77 y.o. male.   Chief Complaint: Right knee pain HPI: Patient is here for his H&P. Patient is scheduled for a right total knee replacement by Dr. Shelle Iron on 05/11/23 at [facility].  Dr. Shelle Iron and the patient mutually agreed to proceed with a total knee replacement. Risks and benefits of the procedure were discussed including stiffness, suboptimal range of motion, persistent pain, infection requiring removal of prosthesis and reinsertion, need for prophylactic antibiotics in the future, for example, dental procedures, possible need for manipulation, revision in the future and also anesthetic complications including DVT, PE, etc. We discussed the perioperative course, time in the hospital, postoperative recovery and the need for elevation to control swelling. We also discussed the predicted range of motion and the probability that squatting and kneeling would be unobtainable in the future. In addition, postoperative anticoagulation was discussed. We have obtained preoperative medical clearance as necessary. Provided illustrated handout and discussed it in detail. They will enroll in the total joint replacement educational forum at the hospital.  He has already had his pre-op at [facility]. He has been instructed when to stop NSAIDs, ASA, supplements.  Past Medical History:  Diagnosis Date   Arthritis    Cancer of prostate (HCC)    Cancer of skin    Chronic kidney disease    prostate cancer    GERD (gastroesophageal reflux disease)    Hypertension    Pituitary adenoma (HCC)    Benign per patient.   PONV (postoperative nausea and vomiting)    Superficial basal cell carcinoma (BCC) 12/12/2020   Right Parotid Area    Past Surgical History:  Procedure Laterality Date   CARPAL TUNNEL WITH CUBITAL TUNNEL Right    By Dr. Amanda Pea   CHOLECYSTECTOMY     laparoscopic   EYE SURGERY Bilateral    cataract extraction w/ IOL   INGUINAL HERNIA REPAIR Right 01/19/2023   Procedure:  HERNIA REPAIR INGUINAL ADULT W/ MESH;  Surgeon: Lewie Chamber, DO;  Location: [facility];  Service: General;  Laterality: Right;   OTHER SURGICAL HISTORY     right shoulder surgery    OTHER SURGICAL HISTORY     right knee arthroscopic surgery    OTHER SURGICAL HISTORY  01/05/2003   ACDF  C5-6  by Dr. Noel Gerold   ROBOT ASSISTED LAPAROSCOPIC RADICAL PROSTATECTOMY  07/16/2011   Procedure: ROBOTIC ASSISTED LAPAROSCOPIC RADICAL PROSTATECTOMY;  Surgeon: Valetta Fuller, MD;  Location: [facility];  Service: Urology;  Laterality: N/A;  Robtic Assisted Laproscopic Radical Retropubic Prostatetomy      Family History  Problem Relation Age of Onset   Heart attack Father    Cancer Brother    Leukemia Brother    Social History:  reports that he has quit smoking. He has quit using smokeless tobacco. He reports that he does not drink alcohol and does not use drugs.  Allergies: No Known Allergies  Current meds: Baby Aspirin 81 mg chewable tablet PriLOSEC  Review of Systems  Constitutional: Negative.   HENT: Negative.    Eyes: Negative.   Respiratory: Negative.    Cardiovascular: Negative.   Gastrointestinal: Negative.   Endocrine: Negative.   Genitourinary: Negative.   Musculoskeletal:  Positive for arthralgias, gait problem, joint swelling and myalgias.  Skin: Negative.   Hematological: Negative.   Psychiatric/Behavioral: Negative.      There were no vitals taken for this visit. Physical Exam Constitutional:      Appearance: Normal appearance.  HENT:  Head: Normocephalic and atraumatic.     Right Ear: External ear normal.     Left Ear: External ear normal.     Nose: Nose normal.     Mouth/Throat:     Pharynx: Oropharynx is clear.  Eyes:     Conjunctiva/sclera: Conjunctivae normal.  Cardiovascular:     Rate and Rhythm: Normal rate and regular rhythm.     Pulses: Normal pulses.     Heart sounds: Normal heart sounds.  Pulmonary:     Effort: Pulmonary effort is normal.      Breath sounds: Normal breath sounds.  Abdominal:     General: Bowel sounds are normal.  Musculoskeletal:     Cervical back: Normal range of motion.     Comments: Is tender medial joint line is ranges -5 to 90. He has patellofemoral pain compression. He walks with an antalgic gait. Ipsilateral hip and ankle exam is unremarkable. Patient does have a fullness posteriorly. This consistent with Baker's cyst  Skin:    General: Skin is warm and dry.  Neurological:     Mental Status: He is alert.      Assessment/Plan Impression: R knee end-stage DJD refractory to conservative tx  Plan: Pt with end-stage knee DJD, bone-on-bone, refractory to conservative tx, scheduled for right total knee replacement by Dr. Shelle Iron on 05/11/23. We again discussed the procedure itself as well as risks, complications and alternatives, including but not limited to DVT, PE, infx, bleeding, failure of procedure, need for secondary procedure including manipulation, nerve injury, ongoing pain/symptoms, anesthesia risk, even stroke or death. Also discussed typical post-op protocols, activity restrictions, need for PT, flexion/extension exercises, time out of work. Discussed need for DVT ppx post-op per protocol. Discussed dental ppx and infx prevention. Also discussed limitations post-operatively such as kneeling and squatting. All questions were answered. Patient desires to proceed with surgery as scheduled.  Will hold supplements, ASA and NSAIDs accordingly. Will remain NPO after midnight the night before surgery. Will present to [facility] for pre-op testing. Anticipate hospital stay to include at least 2 midnights given medical history and to ensure proper pain control. Plan ASA for DVT ppx post-op. Plan Oxy, Tizanidine, Colace, Miralax. Plan home post-op with family members at home for assistance, outpt PT scheduled for [facility] [place]. Will follow up 10-14 days post-op for staple removal and xrays.  Plan Right total knee  replacement  [name], PA-C for Dr Shelle Iron 05/08/2023, 8:19 AM

## 2023-05-08 NOTE — H&P (Signed)
Dwayne [name] is an 77 y.o. male.   Chief Complaint: Right knee pain HPI: Patient is here for his H&P. Patient is scheduled for a right total knee replacement by Dr. Shelle [name] on 05/11/23 at [facility].  Dr. Shelle [name] and the patient mutually agreed to proceed with a total knee replacement. Risks and benefits of the procedure were discussed including stiffness, suboptimal range of motion, persistent pain, infection requiring removal of prosthesis and reinsertion, need for prophylactic antibiotics in the future, for example, dental procedures, possible need for manipulation, revision in the future and also anesthetic complications including DVT, PE, etc. We discussed the perioperative course, time in the hospital, postoperative recovery and the need for elevation to control swelling. We also discussed the predicted range of motion and the probability that squatting and kneeling would be unobtainable in the future. In addition, postoperative anticoagulation was discussed. We have obtained preoperative medical clearance as necessary. Provided illustrated handout and discussed it in detail. They will enroll in the total joint replacement educational forum at the hospital.  He has already had his pre-op at [facility]. He has been instructed when to stop NSAIDs, ASA, supplements.  Past Medical History:  Diagnosis Date   Arthritis    Cancer of prostate (HCC)    Cancer of skin    Chronic kidney disease    prostate cancer    GERD (gastroesophageal reflux disease)    Hypertension    Pituitary adenoma (HCC)    Benign per patient.   PONV (postoperative nausea and vomiting)    Superficial basal cell carcinoma (BCC) 12/12/2020   Right Parotid Area    Past Surgical History:  Procedure Laterality Date   CARPAL TUNNEL WITH CUBITAL TUNNEL Right    By Dr. Amanda [name]   CHOLECYSTECTOMY     laparoscopic   EYE SURGERY Bilateral    cataract extraction w/ IOL   INGUINAL HERNIA REPAIR Right 01/19/2023   Procedure:  HERNIA REPAIR INGUINAL ADULT W/ MESH;  Surgeon: Dwayne [name];  Location: [facility];  Service: General;  Laterality: Right;   OTHER SURGICAL HISTORY     right shoulder surgery    OTHER SURGICAL HISTORY     right knee arthroscopic surgery    OTHER SURGICAL HISTORY  01/05/2003   ACDF  C5-6  by Dr. Noel [name]   ROBOT ASSISTED LAPAROSCOPIC RADICAL PROSTATECTOMY  07/16/2011   Procedure: ROBOTIC ASSISTED LAPAROSCOPIC RADICAL PROSTATECTOMY;  Surgeon: Dwayne [name];  Location: [facility];  Service: Urology;  Laterality: N/A;  Robtic Assisted Laproscopic Radical Retropubic Prostatetomy      Family History  Problem Relation Age of Onset   Heart attack Father    Cancer Brother    Leukemia Brother    Social History:  reports that he has quit smoking. He has quit using smokeless tobacco. He reports that he does not drink alcohol and does not use drugs.  Allergies: No Known Allergies  Current meds: Baby Aspirin 81 mg chewable tablet PriLOSEC  Review of Systems  Constitutional: Negative.   HENT: Negative.    Eyes: Negative.   Respiratory: Negative.    Cardiovascular: Negative.   Gastrointestinal: Negative.   Endocrine: Negative.   Genitourinary: Negative.   Musculoskeletal:  Positive for arthralgias, gait problem, joint swelling and myalgias.  Skin: Negative.   Hematological: Negative.   Psychiatric/Behavioral: Negative.      There were no vitals taken for this visit. Physical Exam Constitutional:      Appearance: Normal appearance.  HENT:  Head: Normocephalic and atraumatic.     Right Ear: External ear normal.     Left Ear: External ear normal.     Nose: Nose normal.     Mouth/Throat:     Pharynx: Oropharynx is clear.  Eyes:     Conjunctiva/sclera: Conjunctivae normal.  Cardiovascular:     Rate and Rhythm: Normal rate and regular rhythm.     Pulses: Normal pulses.     Heart sounds: Normal heart sounds.  Pulmonary:     Effort: Pulmonary effort is normal.      Breath sounds: Normal breath sounds.  Abdominal:     General: Bowel sounds are normal.  Musculoskeletal:     Cervical back: Normal range of motion.     Comments: Is tender medial joint line is ranges -5 to 90. He has patellofemoral pain compression. He walks with an antalgic gait. Ipsilateral hip and ankle exam is unremarkable. Patient does have a fullness posteriorly. This consistent with Baker's cyst  Skin:    General: Skin is warm and dry.  Neurological:     Mental Status: He is alert.      Assessment/Plan Impression: R knee end-stage DJD refractory to conservative tx  Plan: Pt with end-stage knee DJD, bone-on-bone, refractory to conservative tx, scheduled for right total knee replacement by Dr. Shelle [name] on 05/11/23. We again discussed the procedure itself as well as risks, complications and alternatives, including but not limited to DVT, PE, infx, bleeding, failure of procedure, need for secondary procedure including manipulation, nerve injury, ongoing pain/symptoms, anesthesia risk, even stroke or death. Also discussed typical post-op protocols, activity restrictions, need for PT, flexion/extension exercises, time out of work. Discussed need for DVT ppx post-op per protocol. Discussed dental ppx and infx prevention. Also discussed limitations post-operatively such as kneeling and squatting. All questions were answered. Patient desires to proceed with surgery as scheduled.  Will hold supplements, ASA and NSAIDs accordingly. Will remain NPO after midnight the night before surgery. Will present to [facility] for pre-op testing. Anticipate hospital stay to include at least 2 midnights given medical history and to ensure proper pain control. Plan ASA for DVT ppx post-op. Plan Oxy, Tizanidine, Colace, Miralax. Plan home post-op with family members at home for assistance, outpt PT scheduled for [facility] [place]. Will follow up 10-14 days post-op for staple removal and xrays.  Plan Right total knee  replacement  Dwayne Spark, PA-C for Dr Dwayne [name] 05/08/2023, 8:19 AM

## 2023-05-10 NOTE — Anesthesia Preprocedure Evaluation (Signed)
Anesthesia Evaluation  Patient identified by MRN, date of birth, ID band Patient awake    Reviewed: Allergy & Precautions, NPO status , Patient's Chart, lab work & pertinent test results  History of Anesthesia Complications (+) PONV and history of anesthetic complications  Airway Mallampati: II  TM Distance: >3 FB Neck ROM: Full    Dental  (+) Missing,    Pulmonary former smoker   Pulmonary exam normal        Cardiovascular hypertension, Pt. on medications Normal cardiovascular exam     Neuro/Psych Pituitary adenoma    GI/Hepatic Neg liver ROS,GERD  Medicated,,  Endo/Other    Renal/GU      Musculoskeletal  (+) Arthritis ,    Abdominal   Peds  Hematology negative hematology ROS (+)   Anesthesia Other Findings   Reproductive/Obstetrics                              Anesthesia Physical Anesthesia Plan  ASA: 2  Anesthesia Plan: Spinal   Post-op Pain Management: Tylenol PO (pre-op)* and Regional block*   Induction:   PONV Risk Score and Plan: 3 and Treatment may vary due to age or medical condition, Ondansetron, Propofol infusion and Dexamethasone  Airway Management Planned: Natural Airway and Simple Face Mask  Additional Equipment: None  Intra-op Plan:   Post-operative Plan:   Informed Consent: I have reviewed the patients History and Physical, chart, labs and discussed the procedure including the risks, benefits and alternatives for the proposed anesthesia with the patient or authorized representative who has indicated his/her understanding and acceptance.       Plan Discussed with: CRNA  Anesthesia Plan Comments:         Anesthesia Quick Evaluation

## 2023-05-11 ENCOUNTER — Ambulatory Visit (HOSPITAL_COMMUNITY): Payer: PPO | Admitting: Anesthesiology

## 2023-05-11 ENCOUNTER — Encounter (HOSPITAL_COMMUNITY): Payer: Self-pay | Admitting: Specialist

## 2023-05-11 ENCOUNTER — Encounter (HOSPITAL_COMMUNITY)
Admission: RE | Disposition: A | Discharge status: Home / Self Care | Payer: Self-pay | Source: Home / Self Care | Attending: Specialist

## 2023-05-11 ENCOUNTER — Other Ambulatory Visit: Payer: Self-pay

## 2023-05-11 ENCOUNTER — Ambulatory Visit (HOSPITAL_COMMUNITY): Payer: PPO

## 2023-05-11 ENCOUNTER — Ambulatory Visit (HOSPITAL_COMMUNITY)
Admission: RE | Admit: 2023-05-11 | Discharge: 2023-05-12 | Disposition: A | Discharge status: Home / Self Care | Payer: PPO | Attending: Specialist | Admitting: Specialist

## 2023-05-11 DIAGNOSIS — N189 Chronic kidney disease, unspecified: Secondary | ICD-10-CM | POA: Insufficient documentation

## 2023-05-11 DIAGNOSIS — Z87891 Personal history of nicotine dependence: Secondary | ICD-10-CM | POA: Diagnosis not present

## 2023-05-11 DIAGNOSIS — M1711 Unilateral primary osteoarthritis, right knee: Secondary | ICD-10-CM | POA: Diagnosis present

## 2023-05-11 DIAGNOSIS — Z471 Aftercare following joint replacement surgery: Secondary | ICD-10-CM | POA: Diagnosis not present

## 2023-05-11 DIAGNOSIS — Z86018 Personal history of other benign neoplasm: Secondary | ICD-10-CM | POA: Diagnosis not present

## 2023-05-11 DIAGNOSIS — M7121 Synovial cyst of popliteal space [Baker], right knee: Secondary | ICD-10-CM | POA: Insufficient documentation

## 2023-05-11 DIAGNOSIS — M21161 Varus deformity, not elsewhere classified, right knee: Secondary | ICD-10-CM | POA: Diagnosis not present

## 2023-05-11 DIAGNOSIS — I129 Hypertensive chronic kidney disease with stage 1 through stage 4 chronic kidney disease, or unspecified chronic kidney disease: Secondary | ICD-10-CM | POA: Insufficient documentation

## 2023-05-11 DIAGNOSIS — G8918 Other acute postprocedural pain: Secondary | ICD-10-CM | POA: Diagnosis not present

## 2023-05-11 DIAGNOSIS — Z96651 Presence of right artificial knee joint: Secondary | ICD-10-CM | POA: Diagnosis not present

## 2023-05-11 HISTORY — PX: TOTAL KNEE ARTHROPLASTY: SHX125

## 2023-05-11 SURGERY — ARTHROPLASTY, KNEE, TOTAL
Anesthesia: Spinal | Site: Knee | Laterality: Right

## 2023-05-11 MED ORDER — OXYCODONE HCL 5 MG PO TABS
5.0000 mg | ORAL_TABLET | ORAL | Status: DC | PRN
  Administered 2023-05-11 – 2023-05-12 (×3): 5 mg via ORAL
  Filled 2023-05-11 (×3): qty 1

## 2023-05-11 MED ORDER — ONE-DAILY MULTI VITAMINS PO TABS: 1.0000 | ORAL_TABLET | Freq: Every day | ORAL | Status: DC

## 2023-05-11 MED ORDER — POLYETHYLENE GLYCOL 3350 17 G PO PACK: 17.0000 g | PACK | Freq: Every day | ORAL | 0 refills | Status: AC

## 2023-05-11 MED ORDER — METHOCARBAMOL 500 MG PO TABS
500.0000 mg | ORAL_TABLET | Freq: Four times a day (QID) | ORAL | Status: DC | PRN
Start: 2023-05-11 — End: 2023-05-12
  Administered 2023-05-12: 500 mg via ORAL
  Filled 2023-05-11: qty 1

## 2023-05-11 MED ORDER — ORAL CARE MOUTH RINSE: 15.0000 mL | Freq: Once | OROMUCOSAL | Status: AC

## 2023-05-11 MED ORDER — BISACODYL 5 MG PO TBEC: 5.0000 mg | DELAYED_RELEASE_TABLET | Freq: Every day | ORAL | Status: DC | PRN

## 2023-05-11 MED ORDER — BUPIVACAINE-EPINEPHRINE 0.25% -1:200000 IJ SOLN
INTRAMUSCULAR | Status: DC | PRN
  Administered 2023-05-11: 20 mL

## 2023-05-11 MED ORDER — DEXAMETHASONE SODIUM PHOSPHATE 10 MG/ML IJ SOLN
INTRAMUSCULAR | Status: AC
  Filled 2023-05-11: qty 1

## 2023-05-11 MED ORDER — PANTOPRAZOLE SODIUM 40 MG PO TBEC
40.0000 mg | DELAYED_RELEASE_TABLET | Freq: Every day | ORAL | Status: DC
  Administered 2023-05-12: 40 mg via ORAL
  Filled 2023-05-11: qty 1

## 2023-05-11 MED ORDER — DOCUSATE SODIUM 100 MG PO CAPS
100.0000 mg | ORAL_CAPSULE | Freq: Two times a day (BID) | ORAL | Status: DC
  Administered 2023-05-11 – 2023-05-12 (×2): 100 mg via ORAL
  Filled 2023-05-11 (×2): qty 1

## 2023-05-11 MED ORDER — 0.9 % SODIUM CHLORIDE (POUR BTL) OPTIME
TOPICAL | Status: DC | PRN
  Administered 2023-05-11: 1000 mL

## 2023-05-11 MED ORDER — DEXAMETHASONE SODIUM PHOSPHATE 10 MG/ML IJ SOLN
INTRAMUSCULAR | Status: DC | PRN
  Administered 2023-05-11: 8 mg via INTRAVENOUS

## 2023-05-11 MED ORDER — RISAQUAD PO CAPS
1.0000 | ORAL_CAPSULE | Freq: Every day | ORAL | Status: DC
  Administered 2023-05-11 – 2023-05-12 (×2): 1 via ORAL
  Filled 2023-05-11 (×2): qty 1

## 2023-05-11 MED ORDER — BUPIVACAINE-EPINEPHRINE 0.25% -1:200000 IJ SOLN
INTRAMUSCULAR | Status: AC
Start: 2023-05-11 — End: ?
  Filled 2023-05-11: qty 1

## 2023-05-11 MED ORDER — BUPIVACAINE-EPINEPHRINE (PF) 0.5% -1:200000 IJ SOLN
INTRAMUSCULAR | Status: DC | PRN
  Administered 2023-05-11: 15 mL via PERINEURAL

## 2023-05-11 MED ORDER — BUPIVACAINE IN DEXTROSE 0.75-8.25 % IT SOLN
INTRATHECAL | Status: DC | PRN
  Administered 2023-05-11: 1.8 mL via INTRATHECAL

## 2023-05-11 MED ORDER — LISINOPRIL 10 MG PO TABS
10.0000 mg | ORAL_TABLET | Freq: Every day | ORAL | Status: DC
  Administered 2023-05-12: 10 mg via ORAL
  Filled 2023-05-11: qty 1

## 2023-05-11 MED ORDER — FENTANYL CITRATE PF 50 MCG/ML IJ SOSY
100.0000 ug | PREFILLED_SYRINGE | Freq: Once | INTRAMUSCULAR | Status: AC
  Administered 2023-05-11: 50 ug via INTRAVENOUS
  Filled 2023-05-11: qty 2

## 2023-05-11 MED ORDER — CEFAZOLIN SODIUM-DEXTROSE 2-4 GM/100ML-% IV SOLN
2.0000 g | Freq: Four times a day (QID) | INTRAVENOUS | Status: AC
  Administered 2023-05-11 – 2023-05-12 (×2): 2 g via INTRAVENOUS
  Filled 2023-05-11 (×2): qty 100

## 2023-05-11 MED ORDER — SODIUM CHLORIDE (PF) 0.9 % IJ SOLN
INTRAMUSCULAR | Status: AC
  Filled 2023-05-11: qty 40

## 2023-05-11 MED ORDER — LIDOCAINE 2% (20 MG/ML) 5 ML SYRINGE
INTRAMUSCULAR | Status: DC | PRN
  Administered 2023-05-11: 40 mg via INTRAVENOUS

## 2023-05-11 MED ORDER — PROPOFOL 10 MG/ML IV BOLUS
INTRAVENOUS | Status: DC | PRN
  Administered 2023-05-11 (×2): 20 mg via INTRAVENOUS
  Administered 2023-05-11: 10 mg via INTRAVENOUS

## 2023-05-11 MED ORDER — OXYCODONE HCL 5 MG PO TABS: 10.0000 mg | ORAL_TABLET | ORAL | Status: DC | PRN

## 2023-05-11 MED ORDER — ACETAMINOPHEN 500 MG PO TABS
1000.0000 mg | ORAL_TABLET | Freq: Four times a day (QID) | ORAL | Status: DC
  Administered 2023-05-12 (×3): 1000 mg via ORAL
  Filled 2023-05-11 (×4): qty 2

## 2023-05-11 MED ORDER — METHOCARBAMOL 500 MG PO TABS: 500.0000 mg | ORAL_TABLET | Freq: Three times a day (TID) | ORAL | 1 refills | Status: AC | PRN

## 2023-05-11 MED ORDER — FLUTICASONE PROPIONATE 50 MCG/ACT NA SUSP: 1.0000 | Freq: Every day | NASAL | Status: DC | PRN

## 2023-05-11 MED ORDER — ONDANSETRON HCL 4 MG/2ML IJ SOLN: 4.0000 mg | Freq: Four times a day (QID) | INTRAMUSCULAR | Status: DC | PRN

## 2023-05-11 MED ORDER — METHOCARBAMOL 1000 MG/10ML IJ SOLN
500.0000 mg | Freq: Four times a day (QID) | INTRAMUSCULAR | Status: DC | PRN
Start: 2023-05-11 — End: 2023-05-12

## 2023-05-11 MED ORDER — MIDAZOLAM HCL 2 MG/2ML IJ SOLN: 2.0000 mg | Freq: Once | INTRAMUSCULAR | Status: DC

## 2023-05-11 MED ORDER — CEFAZOLIN SODIUM-DEXTROSE 2-4 GM/100ML-% IV SOLN
2.0000 g | INTRAVENOUS | Status: AC
  Administered 2023-05-11: 2 g via INTRAVENOUS
  Filled 2023-05-11: qty 100

## 2023-05-11 MED ORDER — ADULT MULTIVITAMIN W/MINERALS CH
1.0000 | ORAL_TABLET | Freq: Every day | ORAL | Status: DC
  Administered 2023-05-12: 1 via ORAL
  Filled 2023-05-11: qty 1

## 2023-05-11 MED ORDER — PROPOFOL 10 MG/ML IV BOLUS
INTRAVENOUS | Status: AC
  Filled 2023-05-11: qty 20

## 2023-05-11 MED ORDER — ACETAMINOPHEN 10 MG/ML IV SOLN
1000.0000 mg | INTRAVENOUS | Status: AC
  Administered 2023-05-11: 1000 mg via INTRAVENOUS
  Filled 2023-05-11: qty 100

## 2023-05-11 MED ORDER — PHENOL 1.4 % MT LIQD: 1.0000 | OROMUCOSAL | Status: DC | PRN

## 2023-05-11 MED ORDER — BUPIVACAINE LIPOSOME 1.3 % IJ SUSP
INTRAMUSCULAR | Status: AC
  Filled 2023-05-11: qty 20

## 2023-05-11 MED ORDER — MENTHOL 3 MG MT LOZG: 1.0000 | LOZENGE | OROMUCOSAL | Status: DC | PRN

## 2023-05-11 MED ORDER — CHLORHEXIDINE GLUCONATE 0.12 % MT SOLN
15.0000 mL | Freq: Once | OROMUCOSAL | Status: AC
  Administered 2023-05-11: 15 mL via OROMUCOSAL

## 2023-05-11 MED ORDER — LACTATED RINGERS IV SOLN: INTRAVENOUS | Status: DC

## 2023-05-11 MED ORDER — POLYETHYLENE GLYCOL 3350 17 G PO PACK: 17.0000 g | PACK | Freq: Every day | ORAL | Status: DC | PRN

## 2023-05-11 MED ORDER — DOCUSATE SODIUM 100 MG PO CAPS: 100.0000 mg | ORAL_CAPSULE | Freq: Two times a day (BID) | ORAL | 2 refills | Status: AC

## 2023-05-11 MED ORDER — OXYCODONE HCL 5 MG PO TABS: 5.0000 mg | ORAL_TABLET | Freq: Once | ORAL | Status: DC | PRN

## 2023-05-11 MED ORDER — OXYCODONE HCL 5 MG/5ML PO SOLN
5.0000 mg | Freq: Once | ORAL | Status: DC | PRN
Start: 2023-05-11 — End: 2023-05-11

## 2023-05-11 MED ORDER — METOCLOPRAMIDE HCL 5 MG/ML IJ SOLN: 5.0000 mg | Freq: Three times a day (TID) | INTRAMUSCULAR | Status: DC | PRN

## 2023-05-11 MED ORDER — ASPIRIN 81 MG PO CHEW
81.0000 mg | CHEWABLE_TABLET | Freq: Two times a day (BID) | ORAL | Status: DC
  Administered 2023-05-12: 81 mg via ORAL
  Filled 2023-05-11: qty 1

## 2023-05-11 MED ORDER — METOCLOPRAMIDE HCL 5 MG PO TABS: 5.0000 mg | ORAL_TABLET | Freq: Three times a day (TID) | ORAL | Status: DC | PRN

## 2023-05-11 MED ORDER — SODIUM CHLORIDE (PF) 0.9 % IJ SOLN
INTRAMUSCULAR | Status: DC | PRN
  Administered 2023-05-11: 60 mL

## 2023-05-11 MED ORDER — PROPOFOL 1000 MG/100ML IV EMUL
INTRAVENOUS | Status: AC
  Filled 2023-05-11: qty 100

## 2023-05-11 MED ORDER — HYDROMORPHONE HCL 1 MG/ML IJ SOLN: 0.5000 mg | INTRAMUSCULAR | Status: DC | PRN

## 2023-05-11 MED ORDER — FENTANYL CITRATE PF 50 MCG/ML IJ SOSY: 25.0000 ug | PREFILLED_SYRINGE | INTRAMUSCULAR | Status: DC | PRN

## 2023-05-11 MED ORDER — MAGNESIUM CITRATE PO SOLN: 1.0000 | Freq: Once | ORAL | Status: DC | PRN

## 2023-05-11 MED ORDER — PROPOFOL 500 MG/50ML IV EMUL
INTRAVENOUS | Status: DC | PRN
  Administered 2023-05-11: 75 ug/kg/min via INTRAVENOUS

## 2023-05-11 MED ORDER — SODIUM CHLORIDE 0.9 % IR SOLN
Status: DC | PRN
  Administered 2023-05-11 (×2): 1000 mL

## 2023-05-11 MED ORDER — TRANEXAMIC ACID-NACL 1000-0.7 MG/100ML-% IV SOLN
1000.0000 mg | INTRAVENOUS | Status: AC
  Administered 2023-05-11: 1000 mg via INTRAVENOUS
  Filled 2023-05-11: qty 100

## 2023-05-11 MED ORDER — OXYCODONE HCL 5 MG PO TABS: 5.0000 mg | ORAL_TABLET | ORAL | 0 refills | Status: AC | PRN

## 2023-05-11 MED ORDER — DIPHENHYDRAMINE HCL 12.5 MG/5ML PO ELIX: 12.5000 mg | ORAL_SOLUTION | ORAL | Status: DC | PRN

## 2023-05-11 MED ORDER — CLONIDINE HCL (ANALGESIA) 100 MCG/ML EP SOLN
EPIDURAL | Status: DC | PRN
  Administered 2023-05-11: 15 ug

## 2023-05-11 MED ORDER — ONDANSETRON HCL 4 MG PO TABS: 4.0000 mg | ORAL_TABLET | Freq: Four times a day (QID) | ORAL | Status: DC | PRN

## 2023-05-11 MED ORDER — STERILE WATER FOR IRRIGATION IR SOLN
Status: DC | PRN
  Administered 2023-05-11: 1000 mL

## 2023-05-11 MED ORDER — ALUM & MAG HYDROXIDE-SIMETH 200-200-20 MG/5ML PO SUSP: 30.0000 mL | ORAL | Status: DC | PRN

## 2023-05-11 MED ORDER — LIDOCAINE HCL (PF) 2 % IJ SOLN
INTRAMUSCULAR | Status: AC
  Filled 2023-05-11: qty 5

## 2023-05-11 MED ORDER — ONDANSETRON HCL 4 MG/2ML IJ SOLN
INTRAMUSCULAR | Status: AC
  Filled 2023-05-11: qty 2

## 2023-05-11 MED ORDER — ONDANSETRON HCL 4 MG/2ML IJ SOLN
INTRAMUSCULAR | Status: DC | PRN
  Administered 2023-05-11: 4 mg via INTRAVENOUS

## 2023-05-11 MED ORDER — ASPIRIN EC 81 MG PO TBEC: 81.0000 mg | DELAYED_RELEASE_TABLET | Freq: Two times a day (BID) | ORAL | 1 refills | Status: AC

## 2023-05-11 SURGICAL SUPPLY — 63 items
ATTUNE MED DOME PAT 38 KNEE (Knees) IMPLANT
ATTUNE PS FEM RT SZ 7 CEM KNEE (Femur) IMPLANT
ATTUNE PSRP INSR SZ7 6 KNEE (Insert) IMPLANT
BAG COUNTER SPONGE SURGICOUNT (BAG) IMPLANT
BAG DECANTER FOR FLEXI CONT (MISCELLANEOUS) ×1 IMPLANT
BAG ZIPLOCK 12X15 (MISCELLANEOUS) IMPLANT
BASE TIBIAL ROT PLAT SZ 8 KNEE (Knees) IMPLANT
BLADE SAW SGTL 11.0X1.19X90.0M (BLADE) ×1 IMPLANT
BLADE SAW SGTL 13.0X1.19X90.0M (BLADE) ×1 IMPLANT
BLADE SURG SZ10 CARB STEEL (BLADE) ×2 IMPLANT
BNDG ELASTIC 4INX 5YD STR LF (GAUZE/BANDAGES/DRESSINGS) ×1 IMPLANT
BNDG ELASTIC 6INX 5YD STR LF (GAUZE/BANDAGES/DRESSINGS) ×1 IMPLANT
BOWL SMART MIX CTS (DISPOSABLE) ×1 IMPLANT
CEMENT HV SMART SET (Cement) ×2 IMPLANT
COVER SURGICAL LIGHT HANDLE (MISCELLANEOUS) ×1 IMPLANT
CUFF TRNQT CYL 34X4.125X (TOURNIQUET CUFF) ×1 IMPLANT
DRAPE INCISE IOBAN 66X45 STRL (DRAPES) IMPLANT
DRAPE SHEET LG 3/4 BI-LAMINATE (DRAPES) ×1 IMPLANT
DRAPE SURG ORHT 6 SPLT 77X108 (DRAPES) ×2 IMPLANT
DRAPE TOP 10253 STERILE (DRAPES) ×1 IMPLANT
DRAPE U-SHAPE 47X51 STRL (DRAPES) ×1 IMPLANT
DRSG AQUACEL AG ADV 3.5X10 (GAUZE/BANDAGES/DRESSINGS) ×1 IMPLANT
DRSG TEGADERM 4X4.75 (GAUZE/BANDAGES/DRESSINGS) IMPLANT
DURAPREP 26ML APPLICATOR (WOUND CARE) ×1 IMPLANT
ELECT BLADE TIP CTD 4 INCH (ELECTRODE) ×1 IMPLANT
ELECT REM PT RETURN 15FT ADLT (MISCELLANEOUS) ×1 IMPLANT
EVACUATOR 1/8 PVC DRAIN (DRAIN) IMPLANT
GAUZE SPONGE 2X2 8PLY STRL LF (GAUZE/BANDAGES/DRESSINGS) IMPLANT
GLOVE BIO SURGEON STRL SZ7 (GLOVE) ×1 IMPLANT
GLOVE BIOGEL PI IND STRL 7.0 (GLOVE) ×1 IMPLANT
GLOVE BIOGEL PI IND STRL 8 (GLOVE) ×1 IMPLANT
GLOVE SURG SS PI 8.0 STRL IVOR (GLOVE) ×1 IMPLANT
GOWN STRL REUS W/ TWL XL LVL3 (GOWN DISPOSABLE) ×2 IMPLANT
HEMOSTAT SPONGE AVITENE ULTRA (HEMOSTASIS) IMPLANT
HOLDER FOLEY CATH W/STRAP (MISCELLANEOUS) IMPLANT
IMMOBILIZER KNEE 20 (SOFTGOODS) ×1
IMMOBILIZER KNEE 20 THIGH 36 (SOFTGOODS) ×1 IMPLANT
KIT TURNOVER KIT A (KITS) IMPLANT
MANIFOLD NEPTUNE II (INSTRUMENTS) ×1 IMPLANT
NS IRRIG 1000ML POUR BTL (IV SOLUTION) IMPLANT
PACK TOTAL KNEE CUSTOM (KITS) ×1 IMPLANT
PIN STEINMAN FIXATION KNEE (PIN) IMPLANT
PROTECTOR NERVE ULNAR (MISCELLANEOUS) ×1 IMPLANT
SAW OSC TIP CART 19.5X105X1.3 (SAW) IMPLANT
SEALER BIPOLAR AQUA 6.0 (INSTRUMENTS) IMPLANT
SET HNDPC FAN SPRY TIP SCT (DISPOSABLE) ×1 IMPLANT
SOLUTION PRONTOSAN WOUND 350ML (IRRIGATION / IRRIGATOR) ×1 IMPLANT
SPIKE FLUID TRANSFER (MISCELLANEOUS) ×1 IMPLANT
STAPLER VISISTAT (STAPLE) IMPLANT
STRIP CLOSURE SKIN 1/2X4 (GAUZE/BANDAGES/DRESSINGS) IMPLANT
SUT BONE WAX W31G (SUTURE) ×1 IMPLANT
SUT MNCRL AB 4-0 PS2 18 (SUTURE) IMPLANT
SUT STRATAFIX 0 PDS 27 VIOLET (SUTURE) ×1
SUT VIC AB 1 CT1 27XBRD ANTBC (SUTURE) ×3 IMPLANT
SUT VIC AB 2-0 CT1 TAPERPNT 27 (SUTURE) ×3 IMPLANT
SUTURE STRATFX 0 PDS 27 VIOLET (SUTURE) ×1 IMPLANT
SYR 3ML LL SCALE MARK (SYRINGE) IMPLANT
TIBIAL BASE ROT PLAT SZ 8 KNEE (Knees) ×1 IMPLANT
TRAY FOLEY MTR SLVR 16FR STAT (SET/KITS/TRAYS/PACK) ×1 IMPLANT
TUBE SUCTION HIGH CAP CLEAR NV (SUCTIONS) ×1 IMPLANT
WATER STERILE IRR 1000ML POUR (IV SOLUTION) ×1 IMPLANT
WIPE CHG 2% PREP (PERSONAL CARE ITEMS) ×1 IMPLANT
WRAP KNEE MAXI GEL POST OP (GAUZE/BANDAGES/DRESSINGS) ×1 IMPLANT

## 2023-05-11 NOTE — Anesthesia Procedure Notes (Signed)
Anesthesia Regional Block: Adductor canal block   Pre-Anesthetic Checklist: , timeout performed,  Correct Patient, Correct Site, Correct Laterality,  Correct Procedure, Correct Position, site marked,  Risks and benefits discussed,  Pre-op evaluation,  At surgeon's request and post-op pain management  Laterality: Right  Prep: Maximum Sterile Barrier Precautions used, chloraprep       Needles:  Injection technique: Single-shot  Needle Type: Echogenic Stimulator Needle     Needle Length: 9cm  Needle Gauge: 22     Additional Needles:   Procedures:,,,, ultrasound used (permanent image in chart),,    Narrative:  Start time: 05/11/2023 11:36 AM End time: 05/11/2023 11:39 AM Injection made incrementally with aspirations every 5 mL.  Performed by: Personally  Anesthesiologist: Kaylyn Layer, MD  Additional Notes: Risks, benefits, and alternative discussed. Patient gave consent for procedure. Patient prepped and draped in sterile fashion. Sedation administered, patient remains easily responsive to voice. Relevant anatomy identified with ultrasound guidance. Local anesthetic given in 5cc increments with no signs or symptoms of intravascular injection. No pain or paraesthesias with injection. Patient monitored throughout procedure with signs of LAST or immediate complications. Tolerated well. Ultrasound image placed in chart.  Dwayne Greenhouse, MD

## 2023-05-11 NOTE — Anesthesia Postprocedure Evaluation (Signed)
Anesthesia Post Note  Patient: Dwayne [name]  Procedure(s) Performed: TOTAL KNEE ARTHROPLASTY (Right: Knee)     Patient location during evaluation: PACU Anesthesia Type: Spinal Level of consciousness: awake and alert Pain management: pain level controlled Vital Signs Assessment: post-procedure vital signs reviewed and stable Respiratory status: spontaneous breathing, nonlabored ventilation and respiratory function stable Cardiovascular status: blood pressure returned to baseline Postop Assessment: no apparent nausea or vomiting, spinal receding, no headache and no backache Anesthetic complications: no           Shanda Howells

## 2023-05-11 NOTE — Anesthesia Procedure Notes (Signed)
Procedure Name: MAC Date/Time: 05/11/2023 12:20 PM  Performed by: Sindy Guadeloupe, CRNAPre-anesthesia Checklist: Patient identified, Emergency Drugs available, Suction available, Patient being monitored and Timeout performed Oxygen Delivery Method: Simple face mask Placement Confirmation: positive ETCO2

## 2023-05-11 NOTE — Plan of Care (Signed)
  Problem: Education: Goal: Knowledge of General Education information will improve Description: Including pain rating scale, medication(s)/side effects and non-pharmacologic comfort measures Outcome: Progressing   Problem: Activity: Goal: Risk for activity intolerance will decrease Outcome: Progressing   Problem: Nutrition: Goal: Adequate nutrition will be maintained Outcome: Progressing   Problem: Elimination: Goal: Will not experience complications related to bowel motility Outcome: Progressing   Problem: Pain Management: Goal: General experience of comfort will improve Outcome: Progressing   Problem: Activity: Goal: Ability to avoid complications of mobility impairment will improve Outcome: Progressing   Problem: Pain Management: Goal: Pain level will decrease with appropriate interventions Outcome: Progressing   Problem: Skin Integrity: Goal: Will show signs of wound healing Outcome: Progressing   Problem: Education: Goal: Knowledge of the prescribed therapeutic regimen will improve Outcome: Progressing   Problem: Pain Management: Goal: Pain level will decrease with appropriate interventions Outcome: Progressing   Problem: Skin Integrity: Goal: Will show signs of wound healing Outcome: Progressing

## 2023-05-11 NOTE — Care Plan (Signed)
Ortho Bundle Case Management Note  Patient Details  Name: Dwayne [name] MRN: [id number] Date of Birth: [birth year]                  R TKA on 05-11-23  DCP: Home with wife  DME: RW ordered through [facility]  PT: [facility] [place] on 05-14-23 at 4:00pm   DME Arranged:  Dan Humphreys rolling DME Agency:  [employer]    Additional Comments: Please contact me with any questions of if this plan should need to change.   Ennis Forts, RN,CCM [facility]  [phone] 05/11/2023, 12:15 PM

## 2023-05-11 NOTE — Transfer of Care (Signed)
Immediate Anesthesia Transfer of Care Note  Patient: Dwayne [name]  Procedure(s) Performed: TOTAL KNEE ARTHROPLASTY (Right: Knee)  Patient Location: PACU  Anesthesia Type:Spinal  Level of Consciousness: sedated  Airway & Oxygen Therapy: Patient Spontanous Breathing and Patient connected to face mask oxygen  Post-op Assessment: Report given to RN and Post -op Vital signs reviewed and stable  Post vital signs: Reviewed and stable  Last Vitals:  Vitals Value Taken Time  BP 111/63 05/11/23 1500  Temp    Pulse 75 05/11/23 1500  Resp 10 05/11/23 1500  SpO2 98 % 05/11/23 1500    Last Pain:  Vitals:   05/11/23 0936  TempSrc: Oral  PainSc:          Complications: No notable events documented.

## 2023-05-11 NOTE — Anesthesia Procedure Notes (Signed)
Spinal  Patient location during procedure: OR Start time: 05/11/2023 12:26 PM Reason for block: surgical anesthesia Staffing Performed: resident/CRNA  Anesthesiologist: Kaylyn Layer, MD Resident/CRNA: Sindy Guadeloupe, CRNA Performed by: Sindy Guadeloupe, CRNA Authorized by: Kaylyn Layer, MD   Preanesthetic Checklist Completed: patient identified, IV checked, site marked, risks and benefits discussed, surgical consent, monitors and equipment checked, pre-op evaluation and timeout performed Spinal Block Patient position: sitting Prep: DuraPrep and site prepped and draped Patient monitoring: heart rate, cardiac monitor, continuous pulse ox and blood pressure Approach: midline Location: L3-4 Injection technique: single-shot Needle Needle type: Pencan  Needle gauge: 24 G Needle length: 10 cm Assessment Events: CSF return Additional Notes Pt placed in sitting position, timeout performed, spinal kit expiration date checked and verified, + CSF, - heme, pt tolerated well. Dr Stephannie Peters present and supervising throughout SAB placement. Adequate sensory level.

## 2023-05-11 NOTE — Interval H&P Note (Signed)
History and Physical Interval Note:  05/11/2023 12:12 PM  Dwayne [name]  has presented today for surgery, with the diagnosis of Deginerative joint disease Rt knee.  The various methods of treatment have been discussed with the patient and family. After consideration of risks, benefits and other options for treatment, the patient has consented to  Procedure(s): TOTAL KNEE ARTHROPLASTY (Right) as a surgical intervention.  The patient's history has been reviewed, patient examined, no change in status, stable for surgery.  I have reviewed the patient's chart and labs.  Questions were answered to the patient's satisfaction.     Javier Docker

## 2023-05-11 NOTE — Brief Op Note (Signed)
05/11/2023  12:13 PM  PATIENT:  Dwayne [name]  77 y.o. male  PRE-OPERATIVE DIAGNOSIS:  Deginerative joint disease Rt knee  POST-OPERATIVE DIAGNOSIS:  * No post-op diagnosis entered *  PROCEDURE:  Procedure(s): TOTAL KNEE ARTHROPLASTY (Right)  SURGEON:  Surgeons and Role:    Jene Every, MD - Primary The aquamantis was utilized for this case to help facilitate better hemostasis as patient was felt to be at increased risk of bleeding because of complex case requiring increased OR time and/or exposure.   PHYSICIAN ASSISTANT:   ASSISTANTS: Bissell TT  ANESTHESIA:   spinal  EBL:  50   BLOOD ADMINISTERED: none  DRAINS: none   LOCAL MEDICATIONS USED:  MARCAINE     SPECIMEN:  No Specimen  DISPOSITION OF SPECIMEN:  N/A  COUNTS:  YES and NO yes  DICTATION: .Other Dictation: Dictation Number   [id number]  PLAN OF CARE: Admit for overnight observation  PATIENT DISPOSITION:  PACU - hemodynamically stable.   Delay start of Pharmacological VTE agent (>24hrs) due to surgical blood loss or risk of bleeding: no

## 2023-05-11 NOTE — Discharge Instructions (Signed)
Elevate leg above heart 6x a day for each Use knee immobilizer while walking until can SLR x 10 Use knee immobilizer in bed to keep knee in extension Aquacel dressing may remain in place for 1 week. May shower with aquacel dressing in place. If the dressing becomes saturated or peels off, you may remove aquacel dressing. Do not remove steri-strips if they are present. Place new dressing with gauze and tape or ACE bandage which should be kept clean and dry and changed daily.  INSTRUCTIONS AFTER JOINT REPLACEMENT   Remove items at home which could result in a fall. This includes throw rugs or furniture in walking pathways ICE to the affected joint every three hours while awake for 30 minutes at a time, for at least the first 3-5 days, and then as needed for pain and swelling.  Continue to use ice for pain and swelling. You may notice swelling that will progress down to the foot and ankle.  This is normal after surgery.  Elevate your leg when you are not up walking on it.   Continue to use the breathing machine you got in the hospital (incentive spirometer) which will help keep your temperature down.  It is common for your temperature to cycle up and down following surgery, especially at night when you are not up moving around and exerting yourself.  The breathing machine keeps your lungs expanded and your temperature down.   DIET:  As you were doing prior to hospitalization, we recommend a well-balanced diet.  DRESSING / WOUND CARE / SHOWERING  You may change your dressing 1 week after surgery.  Then change the dressing every day with sterile gauze.  Please use good hand washing techniques before changing the dressing.  Do not use any lotions or creams on the incision until instructed by your surgeon.  ACTIVITY  Increase activity slowly as tolerated, but follow the weight bearing instructions below.   No driving for 6 weeks or until further direction given by your physician.  You cannot  drive while taking narcotics.  No lifting or carrying greater than 10 lbs. until further directed by your surgeon. Avoid periods of inactivity such as sitting longer than an hour when not asleep. This helps prevent blood clots.  You may return to work once you are authorized by your doctor.     WEIGHT BEARING   Weight bearing as tolerated with assist device (walker, cane, etc) as directed, use it as long as suggested by your surgeon or therapist, typically at least 4-6 weeks.   EXERCISES  Results after joint replacement surgery are often greatly improved when you follow the exercise, range of motion and muscle strengthening exercises prescribed by your doctor. Safety measures are also important to protect the joint from further injury. Any time any of these exercises cause you to have increased pain or swelling, decrease what you are doing until you are comfortable again and then slowly increase them. If you have problems or questions, call your caregiver or physical therapist for advice.   Rehabilitation is important following a joint replacement. After just a few days of immobilization, the muscles of the leg can become weakened and shrink (atrophy).  These exercises are designed to build up the tone and strength of the thigh and leg muscles and to improve motion. Often times heat used for twenty to thirty minutes before working out will loosen up your tissues and help with improving the range of motion but do not use heat for  the first two weeks following surgery (sometimes heat can increase post-operative swelling).   These exercises can be done on a training (exercise) mat, on the floor, on a table or on a bed. Use whatever works the best and is most comfortable for you.    Use music or television while you are exercising so that the exercises are a pleasant break in your day. This will make your life better with the exercises acting as a break in your routine that you can look forward to.    Perform all exercises about fifteen times, three times per day or as directed.  You should exercise both the operative leg and the other leg as well.  Exercises include:   Quad Sets - Tighten up the muscle on the front of the thigh (Quad) and hold for 5-10 seconds.   Straight Leg Raises - With your knee straight (if you were given a brace, keep it on), lift the leg to 60 degrees, hold for 3 seconds, and slowly lower the leg.  Perform this exercise against resistance later as your leg gets stronger.  Leg Slides: Lying on your back, slowly slide your foot toward your buttocks, bending your knee up off the floor (only go as far as is comfortable). Then slowly slide your foot back down until your leg is flat on the floor again.  Angel Wings: Lying on your back spread your legs to the side as far apart as you can without causing discomfort.  Hamstring Strength:  Lying on your back, push your heel against the floor with your leg straight by tightening up the muscles of your buttocks.  Repeat, but this time bend your knee to a comfortable angle, and push your heel against the floor.  You may put a pillow under the heel to make it more comfortable if necessary.   A rehabilitation program following joint replacement surgery can speed recovery and prevent re-injury in the future due to weakened muscles. Contact your doctor or a physical therapist for more information on knee rehabilitation.    CONSTIPATION  Constipation is defined medically as fewer than three stools per week and severe constipation as less than one stool per week.  Even if you have a regular bowel pattern at home, your normal regimen is likely to be disrupted due to multiple reasons following surgery.  Combination of anesthesia, postoperative narcotics, change in appetite and fluid intake all can affect your bowels.   YOU MUST use at least one of the following options; they are listed in order of increasing strength to get the job done.   They are all available over the counter, and you may need to use some, POSSIBLY even all of these options:    Drink plenty of fluids (prune juice may be helpful) and high fiber foods Colace 100 mg by mouth twice a day  Senokot for constipation as directed and as needed Dulcolax (bisacodyl), take with full glass of water  Miralax (polyethylene glycol) once or twice a day as needed.  If you have tried all these things and are unable to have a bowel movement in the first 3-4 days after surgery call either your surgeon or your primary doctor.    If you experience loose stools or diarrhea, hold the medications until you stool forms back up.  If your symptoms do not get better within 1 week or if they get worse, check with your doctor.  If you experience "the worst abdominal pain ever" or develop nausea  or vomiting, please contact the office immediately for further recommendations for treatment.   ITCHING:  If you experience itching with your medications, try taking only a single pain pill, or even half a pain pill at a time.  You can also use Benadryl over the counter for itching or also to help with sleep.   TED HOSE STOCKINGS:  Use stockings on both legs until for at least 2 weeks or as directed by physician office. They may be removed at night for sleeping.  MEDICATIONS:  See your medication summary on the "After Visit Summary" that nursing will review with you.  You may have some home medications which will be placed on hold until you complete the course of blood thinner medication.  It is important for you to complete the blood thinner medication as prescribed.  PRECAUTIONS:  If you experience chest pain or shortness of breath - call 911 immediately for transfer to the hospital emergency department.   If you develop a fever greater that 101 F, purulent drainage from wound, increased redness or drainage from wound, foul odor from the wound/dressing, or calf pain - CONTACT YOUR SURGEON.                                                    FOLLOW-UP APPOINTMENTS:  If you do not already have a post-op appointment, please call the office for an appointment to be seen by your surgeon.  Guidelines for how soon to be seen are listed in your "After Visit Summary", but are typically between 1-4 weeks after surgery.  OTHER INSTRUCTIONS:   Knee Replacement:  Do not place pillow under knee, focus on keeping the knee straight while resting. CPM instructions: 0-90 degrees, 2 hours in the morning, 2 hours in the afternoon, and 2 hours in the evening. Place foam block, curve side up under heel at all times except when in CPM or when walking.  DO NOT modify, tear, cut, or change the foam block in any way.  POST-OPERATIVE OPIOID TAPER INSTRUCTIONS: It is important to wean off of your opioid medication as soon as possible. If you do not need pain medication after your surgery it is ok to stop day one. Opioids include: Codeine, Hydrocodone(Norco, Vicodin), Oxycodone(Percocet, oxycontin) and hydromorphone amongst others.  Long term and even short term use of opiods can cause: Increased pain response Dependence Constipation Depression Respiratory depression And more.  Withdrawal symptoms can include Flu like symptoms Nausea, vomiting And more Techniques to manage these symptoms Hydrate well Eat regular healthy meals Stay active Use relaxation techniques(deep breathing, meditating, yoga) Do Not substitute Alcohol to help with tapering If you have been on opioids for less than two weeks and do not have pain than it is ok to stop all together.  Plan to wean off of opioids This plan should start within one week post op of your joint replacement. Maintain the same interval or time between taking each dose and first decrease the dose.  Cut the total daily intake of opioids by one tablet each day Next start to increase the time between doses. The last dose that should be eliminated is the evening dose.    MAKE SURE YOU:  Understand these instructions.  Get help right away if you are not doing well or get worse.    Thank you for letting  us be a part of your medical care team.  It is a privilege we respect greatly.  We hope these instructions will help you stay on track for a fast and full recovery!

## 2023-05-11 NOTE — Op Note (Unsigned)
NAME: Dwayne [name], Dwayne [name] MEDICAL RECORD NO: [id number] ACCOUNT NO: [id number] DATE OF BIRTH: 1945-07-30 FACILITY: [facility] LOCATION: [facility] PHYSICIAN: Javier Docker, MD  Operative Report   DATE OF PROCEDURE: 05/11/2023  PREOPERATIVE DIAGNOSIS:  End-stage osteoarthrosis, varus deformity of the right knee.  POSTOPERATIVE DIAGNOSIS:  End-stage osteoarthrosis, varus deformity of the right knee.  PROCEDURE PERFORMED:  Right total knee arthroplasty utilizing a Attune DePuy rotating platform 7 femur, 8 tibia, 6 mm insert, 38 patella.  ANESTHESIA:  Spinal.  ASSISTANT:  Orland Penman, PA.  HISTORY:  A 77 year old with end-stage osteoarthrosis medial compartment right knee indicated for replacement of the degenerated joint.  Risks and benefits discussed including bleeding, infection, damage to neurovascular structures, no change in symptoms  or worsening of symptoms, DVT, PE, anesthetic complications, etc.  DESCRIPTION OF PROCEDURE:  The patient was placed in the supine position.  After adequate spinal anesthesia, 2 g of Kefzol, the right lower extremity was prepped, draped and exsanguinated in the usual sterile fashion.  Thigh tourniquet inflated to 225  mmHg.  A midline incision then made over the knee.  Full-thickness flaps developed.  Medial parapatellar arthrotomy was performed.  Soft tissue was elevated medially, preserved the MCL.  Fat pad debrided.  Patella was gently everted.  Knee was flexed.   Tricompartmental osteoarthrosis was noted bone-on-bone.  Remnants of medial and lateral menisci excised.   A notch was placed above the femoral notch as a starting hole for the femoral drill, which was drilled in line with the femur.  This was irrigated, T-handle confirming the intramedullary canal.  Intramedullary guide 5-degree right off the distal femur  due to a slight flexion contracture.  This was pinned.  I performed a distal femoral cut.  I sized off the anterior cortex of the femur to a  7.  This was then pinned in 3 degrees of external rotation.  I then put a distal femoral cutting block.  I  performed an anterior, posterior, and chamfer cut.  Soft tissue was protected posteriorly at all times and then subluxed the tibia.  Remnants of medial and lateral menisci were removed.  Evacuated a Baker's cyst.  External alignment guide free from the  defect, which was medial, parallel to the shaft, bisected the tibiotalar joint.  Three-degree slope.  This was pinned, I performed our tibial cut.  Following this, flexor extension gaps were then tested.  He was slightly loose in flexion.  We took two  additional millimeters off the distal femur, the chamfer cuts, and the distal femoral cut and the box cut.  The box cut prior to this was then performed bisecting the canals.  Box cuts did applied.  I performed a box cut.  Following this, the tibia was  subluxed and measured to an 8 just to the medial aspect of the tibial tubercle.  Pin harvested bone centrally and impacted into the distal femur.  I drilled centrally with a punch fin guide.  Trial femur was placed and a 5-mm insert and a 7 and then a 6  which fit optimally.  Then everted the patella, measured it to a 25, planned it to a 15 utilizing the external patellar jig.  Size of 38 with trial paddle parallel to the joint surface and drilled our lug holes, placed a trial of patella, reduced it and  had excellent patellofemoral tracking.  Following this, all instrumentation was removed, checked posteriorly, cauterized the geniculates with an Aquamantys.  Used Exparel through the posterior capsule aspirating first  without a break the vacuum and injecting 10 mL.  I anesthetized the medial  and lateral meniscus, the periosteum of the femur, quadriceps, proximal tibia, etc.  Following this, she was pulsatile lavaged.  Thoroughly cleaned all surfaces, flexed the knee, all surfaces thoroughly dried and mixed cement on the back table under  vacuum.  I then  placed cement in the proximal tibia, digitally pressurizing it.  Cement was placed on the tibial tray and then impacted into place.  The cement on the femoral component with cement on the femur and the component.  I placed a 6 mm insert,  reduced it, held an axial load throughout the curing of the cement.  Cemented and clamped the patella.  Marcaine with epinephrine was placed in the wound during the curing of the cement as was the Prontosan and the wound was covered.  After appropriate  curing of the cement, the tourniquet was then deflated at 70 minutes.  The bleeding was cauterized.  I then meticulously removed all redundant cement.  Placed trial 6, had full extension, full flexion, good stability to varus and valgus stress at 0 and  30 degrees.  Negative anterior drawer.  Had excellent patellofemoral tracking.  Trial removed.  Copiously irrigated with pulsatile lavage followed by Prontosan and placed a 6 permanent insert.  Reduced it and again had full extension, full flexion, good  stability with varus and valgus stress at 0 and 30 degrees.  Negative anterior drawer.  In mid flexion, I reapproximated the patellar arthrotomy with #1 Vicryl interrupted figure-of-eight sutures oversewn with a running Stratafix.  Had excellent  patellofemoral tracking following this.  Negative anterior drawer and excellent stability.  Copiously irrigated subcutaneous tissue, which was closed with 2-0 Vicryl suture and then skin with staples.  The wound was dressed sterilely, placed in a mobilizer, extubated without difficulty and transported to the recovery room in satisfactory  condition.  The patient tolerated the procedure well.  No complications.  Assistant, Orland Penman, PA was used throughout the case for patient positioning, traction and closure.  BLOOD LOSS:  50 mL.   PUS D: 05/11/2023 2:43:42 pm T: 05/11/2023 3:42:00 pm  JOB: [id number]/ [id number]

## 2023-05-12 ENCOUNTER — Encounter (HOSPITAL_COMMUNITY): Payer: Self-pay | Admitting: Specialist

## 2023-05-12 DIAGNOSIS — M1711 Unilateral primary osteoarthritis, right knee: Secondary | ICD-10-CM | POA: Diagnosis not present

## 2023-05-12 DIAGNOSIS — Z96651 Presence of right artificial knee joint: Secondary | ICD-10-CM | POA: Diagnosis not present

## 2023-05-12 LAB — CBC
HCT: 37.9 % — ABNORMAL LOW (ref 39.0–52.0)
Hemoglobin: 13.1 g/dL (ref 13.0–17.0)
MCH: 30.8 pg (ref 26.0–34.0)
MCHC: 34.6 g/dL (ref 30.0–36.0)
MCV: 89.2 fL (ref 80.0–100.0)
Platelets: 162 10*3/uL (ref 150–400)
RBC: 4.25 MIL/uL (ref 4.22–5.81)
RDW: 12.7 % (ref 11.5–15.5)
WBC: 13.1 10*3/uL — ABNORMAL HIGH (ref 4.0–10.5)
nRBC: 0 % (ref 0.0–0.2)

## 2023-05-12 LAB — BASIC METABOLIC PANEL
Anion gap: 8 (ref 5–15)
BUN: 19 mg/dL (ref 8–23)
CO2: 23 mmol/L (ref 22–32)
Calcium: 8.9 mg/dL (ref 8.9–10.3)
Chloride: 109 mmol/L (ref 98–111)
Creatinine, Ser: 0.89 mg/dL (ref 0.61–1.24)
GFR, Estimated: >60 mL/min (ref >60)
Glucose, Bld: 119 mg/dL — ABNORMAL HIGH (ref 70–99)
Potassium: 4.2 mmol/L (ref 3.5–5.1)
Sodium: 140 mmol/L (ref 135–145)

## 2023-05-12 NOTE — Progress Notes (Signed)
Subjective: 1 Day Post-Op Procedure(s) (LRB): TOTAL KNEE ARTHROPLASTY (Right) Patient reports pain as 4 on 0-10 scale.   Denies CP or SOB.  Voiding without difficulty. Positive flatus. Objective: Vital signs in last 24 hours: Temp:  [97.5 F (36.4 C)-98.5 F (36.9 C)] 97.7 F (36.5 C) (12/17 0522) Pulse Rate:  [56-75] 66 (12/17 0522) Resp:  [10-18] 17 (12/17 0522) BP: (111-172)/(56-91) 144/77 (12/17 0522) SpO2:  [96 %-100 %] 97 % (12/17 0522) Weight:  [85.7 kg] 85.7 kg (12/16 0931)  Intake/Output from previous day: 12/16 0701 - 12/17 0700 In: 1820.9 [P.O.:720; I.V.:800; IV Piggyback:300.9] Out: 3000 [Urine:2950; Blood:50] Intake/Output this shift: No intake/output data recorded.  Recent Labs    05/12/23 0349  HGB 13.1   Recent Labs    05/12/23 0349  WBC 13.1*  RBC 4.25  HCT 37.9*  PLT 162   Recent Labs    05/12/23 0349  NA 140  K 4.2  CL 109  CO2 23  BUN 19  CREATININE 0.89  GLUCOSE 119*  CALCIUM 8.9   No results for input(s): "LABPT", "INR" in the last 72 hours.  Neurologically intact ABD soft Neurovascular intact Sensation intact distally Dorsiflexion/Plantar flexion intact Incision: dressing C/D/I Compartment soft No DVT  Assessment/Plan:  1 Day Post-Op Procedure(s) (LRB): TOTAL KNEE ARTHROPLASTY (Right) {[id number]}   Principal Problem:   Right knee DJD      Javier Docker 05/12/2023, @[facility] 

## 2023-05-12 NOTE — Evaluation (Signed)
Physical Therapy Evaluation Patient Details Name: Dwayne [name] MRN: [id number] DOB: 08-Sep-1945 Today's Date: 05/12/2023  History of Present Illness  77 yo male s/p R TKA on 05/11/23. PMH: OA, CKD, HTN, CTR, inguinal hernia repair  Clinical Impression  Patient evaluated by Physical Therapy with no further acute PT needs identified. All education has been completed and the patient has no further questions.  Pt reports feeling very well. Reviewed areas as below with pt and family as well as TKA HEP. Plan is for OPPT 05/14/23. Pt is ready to d/c from PT standpoint with family assist as needed   See below for any follow-up Physical Therapy or equipment needs. PT is signing off. Thank you for this referral.         If plan is discharge home, recommend the following: Assist for transportation;Help with stairs or ramp for entrance   Can travel by private vehicle        Equipment Recommendations Rolling walker (2 wheels) (delivered)  Recommendations for Other Services       Functional Status Assessment       Precautions / Restrictions Precautions Precautions: Fall;Knee Restrictions Weight Bearing Restrictions Per Provider Order: No Other Position/Activity Restrictions: WBAT      Mobility  Bed Mobility               General bed mobility comments: in recliner and returned to same    Transfers Overall transfer level: Needs assistance Equipment used: Rolling walker (2 wheels) Transfers: Sit to/from Stand Sit to Stand: Contact guard assist, Supervision           General transfer comment: cues for hand placement and RLE position    Ambulation/Gait Ambulation/Gait assistance: Contact guard assist Gait Distance (Feet): 150 Feet Assistive device: Rolling walker (2 wheels) Gait Pattern/deviations: Step-through pattern       General Gait Details: near equal wt shift bil LEs with step through pattern. no LOB, good stability with light support RW  Stairs             Wheelchair Mobility     Tilt Bed    Modified Rankin (Stroke Patients Only)       Balance                                             Pertinent Vitals/Pain Pain Assessment Pain Assessment: 0-10 Pain Score: 1  Pain Location: back of right  knee Pain Descriptors / Indicators: Tightness Pain Intervention(s): Limited activity within patient's tolerance, Monitored during session, Premedicated before session, Repositioned    Home Living Family/patient expects to be discharged to:: Private residence Living Arrangements: Spouse/significant other Available Help at Discharge: Family Type of Home: House Home Access: Ramped entrance       Home Layout: One level Home Equipment: Other (comment) Additional Comments: restorator    Prior Function Prior Level of Function : Independent/Modified Independent                     Extremity/Trunk Assessment   Upper Extremity Assessment Upper Extremity Assessment: Overall WFL for tasks assessed    Lower Extremity Assessment Lower Extremity Assessment: RLE deficits/detail RLE Deficits / Details: ankle WFL, knee AROM grossly 10 to 80 degrees flexion, strength grossly 3 to 3+/5       Communication      Cognition Arousal: Alert Behavior During Therapy: [facility] for  tasks assessed/performed Overall Cognitive Status: Within Functional Limits for tasks assessed                                          General Comments      Exercises Total Joint Exercises Ankle Circles/Pumps: AROM, Both, 10 reps Quad Sets: 10 reps, AROM, Both Heel Slides: AAROM, AROM, Right, 10 reps Straight Leg Raises: AROM, Right, 10 reps   Assessment/Plan    PT Assessment All further PT needs can be met in the next venue of care  PT Problem List         PT Treatment Interventions      PT Goals (Current goals can be found in the Care Plan section)  Acute Rehab PT Goals PT Goal Formulation: All assessment and  education complete, DC therapy    Frequency       Co-evaluation               AM-PAC PT "6 Clicks" Mobility  Outcome Measure Help needed turning from your back to your side while in a flat bed without using bedrails?: None Help needed moving from lying on your back to sitting on the side of a flat bed without using bedrails?: None Help needed moving to and from a bed to a chair (including a wheelchair)?: None Help needed standing up from a chair using your arms (e.g., wheelchair or bedside chair)?: None Help needed to walk in hospital room?: A Little Help needed climbing 3-5 steps with a railing? : A Little 6 Click Score: 22    End of Session Equipment Utilized During Treatment: Gait belt Activity Tolerance: Patient tolerated treatment well Patient left: in chair;with call bell/phone within reach;with family/visitor present (alarm off on arrival) Nurse Communication: Mobility status PT Visit Diagnosis: Other abnormalities of gait and mobility (R26.89)    Time: 0951-1010 PT Time Calculation (min) (ACUTE ONLY): 19 min   Charges:   PT Evaluation $PT Eval Low Complexity: 1 Low   PT General Charges $$ ACUTE PT VISIT: 1 Visit         Zakkiyya Barno, PT  Acute Rehab Dept [facility]) [phone]  05/12/2023   [facility] 05/12/2023, 11:28 AM

## 2023-05-12 NOTE — Progress Notes (Signed)
Subjective: 1 Day Post-Op Procedure(s) (LRB): TOTAL KNEE ARTHROPLASTY (Right) Patient reports pain as mild.  Reports posterior knee pain no other c/o  Objective: Vital signs in last 24 hours: Temp:  [97.5 F (36.4 C)-98.8 F (37.1 C)] 98.8 F (37.1 C) (12/17 0928) Pulse Rate:  [56-83] 83 (12/17 0928) Resp:  [10-18] 16 (12/17 0928) BP: (111-150)/(56-91) 150/77 (12/17 0928) SpO2:  [96 %-100 %] 98 % (12/17 0928)  Intake/Output from previous day: 12/16 0701 - 12/17 0700 In: 1820.9 [P.O.:720; I.V.:800; IV Piggyback:300.9] Out: 3000 [Urine:2950; Blood:50] Intake/Output this shift: Total I/O In: 350 [P.O.:350] Out: 250 [Urine:250]  Recent Labs    05/12/23 0349  HGB 13.1   Recent Labs    05/12/23 0349  WBC 13.1*  RBC 4.25  HCT 37.9*  PLT 162   Recent Labs    05/12/23 0349  NA 140  K 4.2  CL 109  CO2 23  BUN 19  CREATININE 0.89  GLUCOSE 119*  CALCIUM 8.9   No results for input(s): "LABPT", "INR" in the last 72 hours.  Neurologically intact ABD soft Neurovascular intact Sensation intact distally Intact pulses distally Dorsiflexion/Plantar flexion intact Incision: dressing C/D/I and no drainage No cellulitis present Compartment soft No sign of DVT   Assessment/Plan: 1 Day Post-Op Procedure(s) (LRB): TOTAL KNEE ARTHROPLASTY (Right) Advance diet Up with therapy D/C IV fluids    Patient's anticipated LOS is less than 2 midnights, meeting these requirements: - Younger than 40 - Lives within 1 hour of care - Has a competent adult at home to recover with post-op recover - NO history of  - Chronic pain requiring opiods  - Diabetes  - Coronary Artery Disease  - Heart failure  - Heart attack  - Stroke  - DVT/VTE  - Cardiac arrhythmia  - Respiratory Failure/COPD  - Renal failure  - Anemia  - Advanced Liver disease DC to home  Discussed d/c and dressing instructions    [name] 05/12/2023, 12:22 PM

## 2023-05-12 NOTE — TOC Transition Note (Signed)
Transition of Care [facility]) - Discharge Note   Patient Details  Name: Dwayne [name] MRN: [id number] Date of Birth: Jul 21, 1945  Transition [facility]) CM/SW Contact:  Amada Jupiter, LCSW Phone Number: 05/12/2023, 11:29 AM   Clinical Narrative:     Met with pt and confirming he has received RW to room via [employer].  OPPT already arranged with [facility] ([place]).  No further TOC needs.  Final next level of care: OP Rehab Barriers to Discharge: No Barriers Identified   Patient Goals and CMS Choice Patient states their goals for this hospitalization and ongoing recovery are:: return home          Discharge Placement                       Discharge Plan and Services Additional resources added to the After Visit Summary for                  DME Arranged: Walker rolling DME Agency: [employer]                  Social Drivers of Health (SDOH) Interventions SDOH Screenings   Food Insecurity: No Food Insecurity (05/11/2023)  Housing: Unknown (05/11/2023)  Transportation Needs: No Transportation Needs (05/11/2023)  Utilities: Not At Risk (05/11/2023)  Alcohol Screen: Low Risk  (04/29/2021)  Depression (PHQ2-9): Low Risk  (01/08/2023)  Financial Resource Strain: Low Risk  (04/29/2021)  Physical Activity: Sufficiently Active (04/29/2021)  Social Connections: Socially Integrated (04/29/2021)  Stress: No Stress Concern Present (04/29/2021)  Tobacco Use: Medium Risk (05/11/2023)     Readmission Risk Interventions     No data to display

## 2023-05-12 NOTE — Discharge Summary (Signed)
Physician Discharge Summary   Patient ID: [name] MRN: [id number] DOB/AGE: 1946/05/26 77 y.o.  Admit date: 05/11/2023 Discharge date: 05/12/23  Primary Diagnosis: right knee primary osteoarthritis  Admission Diagnoses:  Past Medical History:  Diagnosis Date   Arthritis    Cancer of prostate (HCC)    Cancer of skin    Chronic kidney disease    prostate cancer    GERD (gastroesophageal reflux disease)    Hypertension    Pituitary adenoma (HCC)    Benign per patient.   PONV (postoperative nausea and vomiting)    Superficial basal cell carcinoma (BCC) 12/12/2020   Right Parotid Area   Discharge Diagnoses:   Principal Problem:   Right knee DJD  Estimated body mass index is 28.32 kg/m as calculated from the following:   Height as of this encounter: 5' 8.5" (1.74 m).   Weight as of this encounter: 85.7 kg.  Procedure:  Procedure(s) (LRB): TOTAL KNEE ARTHROPLASTY (Right)   Consults: None  HPI: see H&P Laboratory Data: Admission on 05/11/2023  Component Date Value Ref Range Status   Sodium 05/12/2023 140  135 - 145 mmol/L Final   Potassium 05/12/2023 4.2  3.5 - 5.1 mmol/L Final   Chloride 05/12/2023 109  98 - 111 mmol/L Final   CO2 05/12/2023 23  22 - 32 mmol/L Final   Glucose, Bld 05/12/2023 119 (H)  70 - 99 mg/dL Final   Glucose reference range applies only to samples taken after fasting for at least 8 hours.   BUN 05/12/2023 19  8 - 23 mg/dL Final   Creatinine, Ser 05/12/2023 0.89  0.61 - 1.24 mg/dL Final   Calcium 78/29/5621 8.9  8.9 - 10.3 mg/dL Final   GFR, Estimated 05/12/2023 >60  >60 mL/min Final   Comment: (NOTE) Calculated using the CKD-EPI Creatinine Equation (2021)    Anion gap 05/12/2023 8  5 - 15 Final   Performed at [facility], [address] [place], [place] [address]   WBC 05/12/2023 13.1 (H)  4.0 - 10.5 K/uL Final   RBC 05/12/2023 4.25  4.22 - 5.81 MIL/uL Final   Hemoglobin 05/12/2023 13.1  13.0 - 17.0 g/dL Final    HCT 78/46/9629 37.9 (L)  39.0 - 52.0 % Final   MCV 05/12/2023 89.2  80.0 - 100.0 fL Final   MCH 05/12/2023 30.8  26.0 - 34.0 pg Final   MCHC 05/12/2023 34.6  30.0 - 36.0 g/dL Final   RDW 52/84/1324 12.7  11.5 - 15.5 % Final   Platelets 05/12/2023 162  150 - 400 K/uL Final   nRBC 05/12/2023 0.0  0.0 - 0.2 % Final   Performed at [facility], [address] [place], [place] [address]  Hospital Outpatient Visit on 04/28/2023  Component Date Value Ref Range Status   MRSA, PCR 04/28/2023 NEGATIVE  NEGATIVE Final   Staphylococcus aureus 04/28/2023 NEGATIVE  NEGATIVE Final   Comment: (NOTE) The Xpert SA Assay (FDA approved for NASAL specimens in patients 39 years of age and older), is one component of a comprehensive surveillance program. It is not intended to diagnose infection nor to guide or monitor treatment. Performed at [facility], [address], [place], [place] [address]    Sodium 04/28/2023 140  135 - 145 mmol/L Final   Potassium 04/28/2023 4.2  3.5 - 5.1 mmol/L Final   Chloride 04/28/2023 108  98 - 111 mmol/L Final   CO2 04/28/2023 24  22 - 32 mmol/L  Final   Glucose, Bld 04/28/2023 89  70 - 99 mg/dL Final   Glucose reference range applies only to samples taken after fasting for at least 8 hours.   BUN 04/28/2023 20  8 - 23 mg/dL Final   Creatinine, Ser 04/28/2023 0.84  0.61 - 1.24 mg/dL Final   Calcium 40/98/1191 9.4  8.9 - 10.3 mg/dL Final   GFR, Estimated 04/28/2023 >60  >60 mL/min Final   Comment: (NOTE) Calculated using the CKD-EPI Creatinine Equation (2021)    Anion gap 04/28/2023 8  5 - 15 Final   Performed at [facility], [address], [place], [place] [address]   WBC 04/28/2023 6.3  4.0 - 10.5 K/uL Final   RBC 04/28/2023 4.97  4.22 - 5.81 MIL/uL Final   Hemoglobin 04/28/2023 15.1  13.0 - 17.0 g/dL Final   HCT 56/21/3086 44.2  39.0 - 52.0 % Final   MCV 04/28/2023 88.9  80.0 - 100.0 fL Final   MCH  04/28/2023 30.4  26.0 - 34.0 pg Final   MCHC 04/28/2023 34.2  30.0 - 36.0 g/dL Final   RDW 57/84/6962 12.8  11.5 - 15.5 % Final   Platelets 04/28/2023 190  150 - 400 K/uL Final   nRBC 04/28/2023 0.0  0.0 - 0.2 % Final   Performed at [facility], [address] [place], [place] [address]     X-Rays:DG Knee 1-2 Views Right Result Date: 05/11/2023 CLINICAL DATA:  Knee replacement EXAM: RIGHT KNEE - 1-2 VIEW COMPARISON:  07/04/2022 FINDINGS: Interval right knee replacement with intact hardware and normal alignment. No fracture. Gas within the soft tissues consistent with recent surgery. IMPRESSION: Status post right knee replacement with expected postsurgical change. Electronically Signed   By: Jasmine Pang M.D.   On: 05/11/2023 16:40    EKG: Orders placed or performed during the hospital encounter of 01/15/23   EKG 12-LEAD   EKG 12-LEAD     Hospital Course: Dwayne [name] is a 77 y.o. who was admitted to [facility]. They were brought to the operating room on 05/11/2023 and underwent Procedure(s): TOTAL KNEE ARTHROPLASTY.  Patient tolerated the procedure well and was later transferred to the recovery room and then to the orthopaedic floor for postoperative care.  They were given PO and IV analgesics for pain control following their surgery.  They were given 24 hours of postoperative antibiotics of  Anti-infectives (From admission, onward)    Start     Dose/Rate Route Frequency Ordered Stop   05/11/23 1830  ceFAZolin (ANCEF) IVPB 2g/100 mL premix        2 g 200 mL/hr over 30 Minutes Intravenous Every 6 hours 05/11/23 1553 05/12/23 0125   05/11/23 0930  ceFAZolin (ANCEF) IVPB 2g/100 mL premix        2 g 200 mL/hr over 30 Minutes Intravenous On call to O.R. 05/11/23 1324 05/11/23 1238      and started on DVT prophylaxis in the form of Aspirin, TED hose, and SCDs .   PT and OT were ordered for total joint protocol.  Discharge planning consulted to help with  postop disposition and equipment needs.  Patient had a good night on the evening of surgery.  They started to get up OOB with therapy on day one.By day one, the patient had progressed with therapy and meeting their goals.  Incision was healing well.  Patient was seen in rounds and was ready to go home.   Diet: Regular diet  Activity:WBAT Follow-up:in 10-14 days Disposition - Home Discharged Condition: good   Discharge Instructions     Call MD / Call 911   Complete by: As directed    If you experience chest pain or shortness of breath, CALL 911 and be transported to the hospital emergency room.  If you develope a fever above 101 F, pus (white drainage) or increased drainage or redness at the wound, or calf pain, call your surgeon's office.   Constipation Prevention   Complete by: As directed    Drink plenty of fluids.  Prune juice may be helpful.  You may use a stool softener, such as Colace (over the counter) 100 mg twice a day.  Use MiraLax (over the counter) for constipation as needed.   Diet - low sodium heart healthy   Complete by: As directed    Increase activity slowly as tolerated   Complete by: As directed    Post-operative opioid taper instructions:   Complete by: As directed    POST-OPERATIVE OPIOID TAPER INSTRUCTIONS: It is important to wean off of your opioid medication as soon as possible. If you do not need pain medication after your surgery it is ok to stop day one. Opioids include: Codeine, Hydrocodone(Norco, Vicodin), Oxycodone(Percocet, oxycontin) and hydromorphone amongst others.  Long term and even short term use of opiods can cause: Increased pain response Dependence Constipation Depression Respiratory depression And more.  Withdrawal symptoms can include Flu like symptoms Nausea, vomiting And more Techniques to manage these symptoms Hydrate well Eat regular healthy meals Stay active Use relaxation techniques(deep breathing, meditating, yoga) Do Not  substitute Alcohol to help with tapering If you have been on opioids for less than two weeks and do not have pain than it is ok to stop all together.  Plan to wean off of opioids This plan should start within one week post op of your joint replacement. Maintain the same interval or time between taking each dose and first decrease the dose.  Cut the total daily intake of opioids by one tablet each day Next start to increase the time between doses. The last dose that should be eliminated is the evening dose.         Allergies as of 05/12/2023   No Known Allergies      Medication List     TAKE these medications    aspirin EC 81 MG tablet Take 1 tablet (81 mg total) by mouth 2 (two) times daily after a meal. What changed: when to take this   docusate sodium 100 MG capsule Commonly known as: Colace Take 1 capsule (100 mg total) by mouth 2 (two) times daily.   fluticasone 50 MCG/ACT nasal spray Commonly known as: FLONASE USE 1 SPRAY IN EACH NOSTRIL TWICE DAILY AS NEEDED   ibuprofen 200 MG tablet Commonly known as: ADVIL Take 400 mg by mouth 2 (two) times daily as needed for moderate pain (pain score 4-6).   lisinopril 10 MG tablet Commonly known as: ZESTRIL TAKE ONE (1) TABLET BY MOUTH EVERY DAY   methocarbamol 500 MG tablet Commonly known as: ROBAXIN Take 1 tablet (500 mg total) by mouth every 8 (eight) hours as needed for muscle spasms.   multivitamin tablet Take 1 tablet by mouth daily.   omeprazole 20 MG capsule Commonly known as: PRILOSEC Take 1 capsule (20 mg total) by mouth daily.   oxyCODONE 5 MG immediate release tablet Commonly known as: Oxy IR/ROXICODONE Take 1 tablet (5 mg total) by mouth every 4 (  four) hours as needed for severe pain (pain score 7-10).   polyethylene glycol 17 g packet Commonly known as: MIRALAX / GLYCOLAX Take 17 g by mouth daily.        Follow-up Information     [name], [place]. Go on 05/28/2023.   Specialty:  Orthopedic Surgery Why: You are scheduled for first post op appt on Thursday January 2 at 10;30am. Contact information: [address] [place] [place] [address] [phone]                 Signed: Andrez Grime PA-C Orthopaedic Surgery 05/12/2023, 12:24 PM

## 2023-05-14 DIAGNOSIS — M25561 Pain in right knee: Secondary | ICD-10-CM | POA: Diagnosis not present

## 2023-05-14 DIAGNOSIS — M25661 Stiffness of right knee, not elsewhere classified: Secondary | ICD-10-CM | POA: Diagnosis not present

## 2023-05-18 DIAGNOSIS — M25561 Pain in right knee: Secondary | ICD-10-CM | POA: Diagnosis not present

## 2023-05-18 DIAGNOSIS — M25661 Stiffness of right knee, not elsewhere classified: Secondary | ICD-10-CM | POA: Diagnosis not present

## 2023-05-22 DIAGNOSIS — M25661 Stiffness of right knee, not elsewhere classified: Secondary | ICD-10-CM | POA: Diagnosis not present

## 2023-05-22 DIAGNOSIS — M25561 Pain in right knee: Secondary | ICD-10-CM | POA: Diagnosis not present

## 2023-05-25 DIAGNOSIS — M25661 Stiffness of right knee, not elsewhere classified: Secondary | ICD-10-CM | POA: Diagnosis not present

## 2023-05-25 DIAGNOSIS — M25561 Pain in right knee: Secondary | ICD-10-CM | POA: Diagnosis not present

## 2023-05-28 DIAGNOSIS — Z5189 Encounter for other specified aftercare: Secondary | ICD-10-CM | POA: Diagnosis not present

## 2023-05-29 DIAGNOSIS — M25661 Stiffness of right knee, not elsewhere classified: Secondary | ICD-10-CM | POA: Diagnosis not present

## 2023-05-29 DIAGNOSIS — M25561 Pain in right knee: Secondary | ICD-10-CM | POA: Diagnosis not present

## 2023-06-04 DIAGNOSIS — M25661 Stiffness of right knee, not elsewhere classified: Secondary | ICD-10-CM | POA: Diagnosis not present

## 2023-06-04 DIAGNOSIS — M25561 Pain in right knee: Secondary | ICD-10-CM | POA: Diagnosis not present

## 2023-06-08 DIAGNOSIS — M25561 Pain in right knee: Secondary | ICD-10-CM | POA: Diagnosis not present

## 2023-06-08 DIAGNOSIS — M25661 Stiffness of right knee, not elsewhere classified: Secondary | ICD-10-CM | POA: Diagnosis not present

## 2023-06-11 DIAGNOSIS — M25561 Pain in right knee: Secondary | ICD-10-CM | POA: Diagnosis not present

## 2023-06-11 DIAGNOSIS — M25661 Stiffness of right knee, not elsewhere classified: Secondary | ICD-10-CM | POA: Diagnosis not present

## 2023-06-16 DIAGNOSIS — M25561 Pain in right knee: Secondary | ICD-10-CM | POA: Diagnosis not present

## 2023-06-16 DIAGNOSIS — M25661 Stiffness of right knee, not elsewhere classified: Secondary | ICD-10-CM | POA: Diagnosis not present

## 2023-06-18 DIAGNOSIS — M25661 Stiffness of right knee, not elsewhere classified: Secondary | ICD-10-CM | POA: Diagnosis not present

## 2023-06-18 DIAGNOSIS — M25561 Pain in right knee: Secondary | ICD-10-CM | POA: Diagnosis not present

## 2023-06-22 DIAGNOSIS — M25661 Stiffness of right knee, not elsewhere classified: Secondary | ICD-10-CM | POA: Diagnosis not present

## 2023-06-22 DIAGNOSIS — M25561 Pain in right knee: Secondary | ICD-10-CM | POA: Diagnosis not present

## 2023-06-26 ENCOUNTER — Other Ambulatory Visit (HOSPITAL_COMMUNITY): Payer: Self-pay | Admitting: Orthopedic Surgery

## 2023-06-26 ENCOUNTER — Ambulatory Visit (HOSPITAL_COMMUNITY)
Admission: RE | Admit: 2023-06-26 | Discharge: 2023-06-26 | Disposition: A | Discharge status: Home / Self Care | Payer: PPO | Source: Ambulatory Visit | Attending: Cardiology | Admitting: Cardiology

## 2023-06-26 DIAGNOSIS — M7989 Other specified soft tissue disorders: Secondary | ICD-10-CM | POA: Insufficient documentation

## 2023-06-26 DIAGNOSIS — M79604 Pain in right leg: Secondary | ICD-10-CM | POA: Insufficient documentation

## 2023-07-09 ENCOUNTER — Other Ambulatory Visit: Payer: Self-pay | Admitting: Nurse Practitioner

## 2023-07-09 DIAGNOSIS — K219 Gastro-esophageal reflux disease without esophagitis: Secondary | ICD-10-CM

## 2023-07-10 ENCOUNTER — Encounter: Payer: Self-pay | Admitting: Nurse Practitioner

## 2023-07-10 ENCOUNTER — Ambulatory Visit: Payer: PPO | Admitting: Nurse Practitioner

## 2023-07-10 VITALS — BP 163/79 | HR 80 | Temp 97.2°F | Ht 68.5 in | Wt 187.6 lb

## 2023-07-10 DIAGNOSIS — I10 Essential (primary) hypertension: Secondary | ICD-10-CM | POA: Diagnosis not present

## 2023-07-10 DIAGNOSIS — K219 Gastro-esophageal reflux disease without esophagitis: Secondary | ICD-10-CM

## 2023-07-10 DIAGNOSIS — E7800 Pure hypercholesterolemia, unspecified: Secondary | ICD-10-CM

## 2023-07-10 MED ORDER — OMEPRAZOLE 20 MG PO CPDR: 20.0000 mg | DELAYED_RELEASE_CAPSULE | Freq: Every day | ORAL | 1 refills | Status: DC

## 2023-07-10 MED ORDER — LISINOPRIL 10 MG PO TABS
ORAL_TABLET | ORAL | 1 refills | Status: DC
Start: 2023-07-10 — End: 2024-01-07

## 2023-07-10 NOTE — Progress Notes (Addendum)
Subjective:    Patient ID: Dwayne [name], male    DOB: [birth year], 78 y.o.   MRN: [id number]   Chief Complaint: medical management of chronic issues     HPI:  Dwayne [name] is a 78 y.o. who identifies as a male who was assigned male at birth.   Social history: Lives with: wife Work history: retired   Water engineer in today for follow up of the following chronic medical issues:  1. Primary hypertension No c/o chest pain, sob or headache. Does  check blood pressure at home. Runs around 120-140 systolic BP Readings from Last 3 Encounters:  07/10/23 (!) 163/79  05/12/23 (!) 150/77  04/28/23 (!) 155/82     2. Gastroesophageal reflux disease without esophagitis Takes omperazole daily and is doing well.   New complaints: none  No Known Allergies Outpatient Encounter Medications as of 07/10/2023  Medication Sig   aspirin EC 81 MG tablet Take 1 tablet (81 mg total) by mouth 2 (two) times daily after a meal.   docusate sodium (COLACE) 100 MG capsule Take 1 capsule (100 mg total) by mouth 2 (two) times daily.   fluticasone (FLONASE) 50 MCG/ACT nasal spray USE 1 SPRAY IN EACH NOSTRIL TWICE DAILY AS NEEDED   ibuprofen (ADVIL) 200 MG tablet Take 400 mg by mouth 2 (two) times daily as needed for moderate pain (pain score 4-6).   lisinopril (ZESTRIL) 10 MG tablet TAKE ONE (1) TABLET BY MOUTH EVERY DAY   methocarbamol (ROBAXIN) 500 MG tablet Take 1 tablet (500 mg total) by mouth every 8 (eight) hours as needed for muscle spasms.   Multiple Vitamin (MULTIVITAMIN) tablet Take 1 tablet by mouth daily.   omeprazole (PRILOSEC) 20 MG capsule TAKE ONE CAPSULE BY MOUTH DAILY   oxyCODONE (OXY IR/ROXICODONE) 5 MG immediate release tablet Take 1 tablet (5 mg total) by mouth every 4 (four) hours as needed for severe pain (pain score 7-10).   polyethylene glycol (MIRALAX / GLYCOLAX) 17 g packet Take 17 g by mouth daily.   No facility-administered encounter medications on file as of 07/10/2023.    Past  Surgical History:  Procedure Laterality Date   CARPAL TUNNEL WITH CUBITAL TUNNEL Right    By Dr. Amanda Pea   CHOLECYSTECTOMY     laparoscopic   EYE SURGERY Bilateral    cataract extraction w/ IOL   INGUINAL HERNIA REPAIR Right 01/19/2023   Procedure: HERNIA REPAIR INGUINAL ADULT W/ MESH;  Surgeon: Lewie Chamber, DO;  Location: [facility];  Service: General;  Laterality: Right;   OTHER SURGICAL HISTORY     right shoulder surgery    OTHER SURGICAL HISTORY     right knee arthroscopic surgery    OTHER SURGICAL HISTORY  01/05/2003   ACDF  C5-6  by Dr. Noel Gerold   ROBOT ASSISTED LAPAROSCOPIC RADICAL PROSTATECTOMY  07/16/2011   Procedure: ROBOTIC ASSISTED LAPAROSCOPIC RADICAL PROSTATECTOMY;  Surgeon: Valetta Fuller, MD;  Location: [facility];  Service: Urology;  Laterality: N/A;  Robtic Assisted Laproscopic Radical Retropubic Prostatetomy     TOTAL KNEE ARTHROPLASTY Right 05/11/2023   Procedure: TOTAL KNEE ARTHROPLASTY;  Surgeon: Jene Every, MD;  Location: [facility];  Service: Orthopedics;  Laterality: Right;    Family History  Problem Relation Age of Onset   Heart attack Father    Cancer Brother    Leukemia Brother       Controlled substance contract: n/a     Review of Systems  Constitutional:  Negative for diaphoresis.  Eyes:  Negative for pain.  Respiratory:  Negative for shortness of breath.   Cardiovascular:  Negative for chest pain, palpitations and leg swelling.  Gastrointestinal:  Negative for abdominal pain.  Endocrine: Negative for polydipsia.  Skin:  Negative for rash.  Neurological:  Negative for dizziness, weakness and headaches.  Hematological:  Does not bruise/bleed easily.  All other systems reviewed and are negative.      Objective:   Physical Exam Vitals and nursing note reviewed.  Constitutional:      Appearance: Normal appearance. He is well-developed.  HENT:     Head: Normocephalic.     Right Ear: Hearing and tympanic membrane normal.     Left  Ear: Hearing and tympanic membrane normal. A PE tube is present.     Nose: Nose normal.     Mouth/Throat:     Mouth: Mucous membranes are moist.     Pharynx: Oropharynx is clear.  Eyes:     Pupils: Pupils are equal, round, and reactive to light.  Neck:     Thyroid: No thyroid mass or thyromegaly.     Vascular: No carotid bruit or JVD.     Trachea: Phonation normal.  Cardiovascular:     Rate and Rhythm: Normal rate and regular rhythm.  Pulmonary:     Effort: Pulmonary effort is normal. No respiratory distress.     Breath sounds: Normal breath sounds.  Abdominal:     General: Bowel sounds are normal.     Palpations: Abdomen is soft.     Tenderness: There is no abdominal tenderness.  Musculoskeletal:        General: Normal range of motion.     Cervical back: Normal range of motion and neck supple.  Lymphadenopathy:     Cervical: No cervical adenopathy.  Skin:    General: Skin is warm and dry.  Neurological:     Mental Status: He is alert and oriented to person, place, and time.  Psychiatric:        Behavior: Behavior normal.        Thought Content: Thought content normal.        Judgment: Judgment normal.     BP (!) 163/79   Pulse 80   Temp (!) 97.2 F (36.2 C)   Ht 5' 8.5" (1.74 m)   Wt 187 lb 9.6 oz (85.1 kg)   SpO2 98%   BMI 28.11 kg/m         Assessment & Plan:  Dwayne [name] comes in today with chief complaint of Medical Management of Chronic Issues   Diagnosis and orders addressed:  1. Primary hypertension Low sodium diet Continue to keep a diary of blood pressure at home - CBC with Differential/Platelet - CMP14+EGFR - Lipid panel - lisinopril (ZESTRIL) 10 MG tablet; TAKE ONE (1) TABLET BY MOUTH EVERY DAY  Dispense: 90 tablet; Refill: 1  2. Gastroesophageal reflux disease without esophagitis Avoid spicy foods Do not eat 2 hours prior to bedtime  - omeprazole (PRILOSEC) 20 MG capsule; Take 1 capsule (20 mg total) by mouth daily.  Dispense: 90  capsule; Refill: 1       Labs pending Health Maintenance reviewed Diet and exercise encouraged  Follow up plan: 6 months   Mary-Margaret Daphine Deutscher, FNP

## 2023-07-10 NOTE — Patient Instructions (Signed)
Fall Prevention in the Home, Adult Falls can cause injuries and can happen to people of all ages. There are many things you can do to make your home safer and to help prevent falls. What actions can I take to prevent falls? General information Use good lighting in all rooms. Make sure to: Replace any light bulbs that burn out. Turn on the lights in dark areas and use night-lights. Keep items that you use often in easy-to-reach places. Lower the shelves around your home if needed. Move furniture so that there are clear paths around it. Do not use throw rugs or other things on the floor that can make you trip. If any of your floors are uneven, fix them. Add color or contrast paint or tape to clearly mark and help you see: Grab bars or handrails. First and last steps of staircases. Where the edge of each step is. If you use a ladder or stepladder: Make sure that it is fully opened. Do not climb a closed ladder. Make sure the sides of the ladder are locked in place. Have someone hold the ladder while you use it. Know where your pets are as you move through your home. What can I do in the bathroom?     Keep the floor dry. Clean up any water on the floor right away. Remove soap buildup in the bathtub or shower. Buildup makes bathtubs and showers slippery. Use non-skid mats or decals on the floor of the bathtub or shower. Attach bath mats securely with double-sided, non-slip rug tape. If you need to sit down in the shower, use a non-slip stool. Install grab bars by the toilet and in the bathtub and shower. Do not use towel bars as grab bars. What can I do in the bedroom? Make sure that you have a light by your bed that is easy to reach. Do not use any sheets or blankets on your bed that hang to the floor. Have a firm chair or bench with side arms that you can use for support when you get dressed. What can I do in the kitchen? Clean up any spills right away. If you need to reach something  above you, use a step stool with a grab bar. Keep electrical cords out of the way. Do not use floor polish or wax that makes floors slippery. What can I do with my stairs? Do not leave anything on the stairs. Make sure that you have a light switch at the top and the bottom of the stairs. Make sure that there are handrails on both sides of the stairs. Fix handrails that are broken or loose. Install non-slip stair treads on all your stairs if they do not have carpet. Avoid having throw rugs at the top or bottom of the stairs. Choose a carpet that does not hide the edge of the steps on the stairs. Make sure that the carpet is firmly attached to the stairs. Fix carpet that is loose or worn. What can I do on the outside of my home? Use bright outdoor lighting. Fix the edges of walkways and driveways and fix any cracks. Clear paths of anything that can make you trip, such as tools or rocks. Add color or contrast paint or tape to clearly mark and help you see anything that might make you trip as you walk through a door, such as a raised step or threshold. Trim any bushes or trees on paths to your home. Check to see if handrails are loose  or broken and that both sides of all steps have handrails. Install guardrails along the edges of any raised decks and porches. Have leaves, snow, or ice cleared regularly. Use sand, salt, or ice melter on paths if you live where there is ice and snow during the winter. Clean up any spills in your garage right away. This includes grease or oil spills. What other actions can I take? Review your medicines with your doctor. Some medicines can cause dizziness or changes in blood pressure, which increase your risk of falling. Wear shoes that: Have a low heel. Do not wear high heels. Have rubber bottoms and are closed at the toe. Feel good on your feet and fit well. Use tools that help you move around if needed. These include: Canes. Walkers. Scooters. Crutches. Ask  your doctor what else you can do to help prevent falls. This may include seeing a physical therapist to learn to do exercises to move better and get stronger. Where to find more information Centers for Disease Control and Prevention, STEADI: TonerPromos.no [employer] on Aging: BaseRingTones.pl National Institute on Aging: BaseRingTones.pl Contact a doctor if: You are afraid of falling at home. You feel weak, drowsy, or dizzy at home. You fall at home. Get help right away if you: Lose consciousness or have trouble moving after a fall. Have a fall that causes a head injury. These symptoms may be an emergency. Get help right away. Call 911. Do not wait to see if the symptoms will go away. Do not drive yourself to the hospital. This information is not intended to replace advice given to you by your health care provider. Make sure you discuss any questions you have with your health care provider. Document Revised: 01/13/2022 Document Reviewed: 01/13/2022 Elsevier Patient Education  2024 [employer].

## 2023-07-11 LAB — CMP14+EGFR
ALT: 13 [IU]/L (ref 0–44)
AST: 18 [IU]/L (ref 0–40)
Albumin: 4.4 g/dL (ref 3.8–4.8)
Alkaline Phosphatase: 83 [IU]/L (ref 44–121)
BUN/Creatinine Ratio: 16 (ref 10–24)
BUN: 15 mg/dL (ref 8–27)
Bilirubin Total: 0.7 mg/dL (ref 0.0–1.2)
CO2: 24 mmol/L (ref 20–29)
Calcium: 9.4 mg/dL (ref 8.6–10.2)
Chloride: 105 mmol/L (ref 96–106)
Creatinine, Ser: 0.92 mg/dL (ref 0.76–1.27)
Globulin, Total: 1.8 g/dL (ref 1.5–4.5)
Glucose: 89 mg/dL (ref 70–99)
Potassium: 4.8 mmol/L (ref 3.5–5.2)
Sodium: 144 mmol/L (ref 134–144)
Total Protein: 6.2 g/dL (ref 6.0–8.5)
eGFR: 86 mL/min/{1.73_m2} (ref >59)

## 2023-07-11 LAB — CBC WITH DIFFERENTIAL/PLATELET
Basophils Absolute: 0 10*3/uL (ref 0.0–0.2)
Basos: 1 %
EOS (ABSOLUTE): 0.1 10*3/uL (ref 0.0–0.4)
Eos: 2 %
Hematocrit: 42.1 % (ref 37.5–51.0)
Hemoglobin: 13.5 g/dL (ref 13.0–17.7)
Immature Grans (Abs): 0 10*3/uL (ref 0.0–0.1)
Immature Granulocytes: 1 %
Lymphocytes Absolute: 2.2 10*3/uL (ref 0.7–3.1)
Lymphs: 36 %
MCH: 29 pg (ref 26.6–33.0)
MCHC: 32.1 g/dL (ref 31.5–35.7)
MCV: 90 fL (ref 79–97)
Monocytes Absolute: 0.4 10*3/uL (ref 0.1–0.9)
Monocytes: 7 %
Neutrophils Absolute: 3.2 10*3/uL (ref 1.4–7.0)
Neutrophils: 53 %
Platelets: 194 10*3/uL (ref 150–450)
RBC: 4.66 x10E6/uL (ref 4.14–5.80)
RDW: 12.7 % (ref 11.6–15.4)
WBC: 6 10*3/uL (ref 3.4–10.8)

## 2023-07-11 LAB — LIPID PANEL
Chol/HDL Ratio: 3.8 {ratio} (ref 0.0–5.0)
Cholesterol, Total: 170 mg/dL (ref 100–199)
HDL: 45 mg/dL (ref >39)
LDL Chol Calc (NIH): 101 mg/dL — ABNORMAL HIGH (ref 0–99)
Triglycerides: 133 mg/dL (ref 0–149)
VLDL Cholesterol Cal: 24 mg/dL (ref 5–40)

## 2023-07-21 DIAGNOSIS — M7121 Synovial cyst of popliteal space [Baker], right knee: Secondary | ICD-10-CM | POA: Diagnosis not present

## 2023-09-02 ENCOUNTER — Other Ambulatory Visit: Payer: Self-pay | Admitting: Nurse Practitioner

## 2023-09-02 DIAGNOSIS — H938X2 Other specified disorders of left ear: Secondary | ICD-10-CM

## 2023-09-07 DIAGNOSIS — Z96651 Presence of right artificial knee joint: Secondary | ICD-10-CM | POA: Diagnosis not present

## 2023-09-07 DIAGNOSIS — Z5189 Encounter for other specified aftercare: Secondary | ICD-10-CM | POA: Diagnosis not present

## 2024-01-07 ENCOUNTER — Encounter: Payer: Self-pay | Admitting: Nurse Practitioner

## 2024-01-07 ENCOUNTER — Ambulatory Visit: Payer: PPO | Admitting: Nurse Practitioner

## 2024-01-07 VITALS — BP 154/74 | HR 68 | Temp 97.6°F | Ht 68.5 in

## 2024-01-07 DIAGNOSIS — K219 Gastro-esophageal reflux disease without esophagitis: Secondary | ICD-10-CM

## 2024-01-07 DIAGNOSIS — Z0001 Encounter for general adult medical examination with abnormal findings: Secondary | ICD-10-CM

## 2024-01-07 DIAGNOSIS — I10 Essential (primary) hypertension: Secondary | ICD-10-CM

## 2024-01-07 DIAGNOSIS — Z0000 Encounter for general adult medical examination without abnormal findings: Secondary | ICD-10-CM

## 2024-01-07 DIAGNOSIS — Z125 Encounter for screening for malignant neoplasm of prostate: Secondary | ICD-10-CM

## 2024-01-07 MED ORDER — OMEPRAZOLE 20 MG PO CPDR: 20.0000 mg | DELAYED_RELEASE_CAPSULE | Freq: Every day | ORAL | 1 refills | Status: AC

## 2024-01-07 MED ORDER — LISINOPRIL 10 MG PO TABS
ORAL_TABLET | ORAL | 1 refills | Status: AC
Start: 2024-01-07 — End: ?

## 2024-01-07 NOTE — Progress Notes (Signed)
 Subjective:    Patient ID: Dwayne [name], male    DOB: Jun 03, 1945, 78 y.o.   MRN: [id number]   Chief Complaint: annual physical    HPI:  Dwayne [name] is a 78 y.o. who identifies as a male who was assigned male at birth.   Social history: Lives with: wife Work history: retired   Water engineer in today for follow up of the following chronic medical issues:  1. Primary hypertension No c/o chest pain, sob or headache. Does  check blood pressure at home. Runs around 120-140 systolic BP Readings from Last 3 Encounters:  07/10/23 (!) 163/79  05/12/23 (!) 150/77  04/28/23 (!) 155/82     2. Gastroesophageal reflux disease without esophagitis Takes omperazole daily and is doing well.   New complaints: Ear feels stopped up and drains at night. Occasional pain. He has had this off and on for 3-4 months.  No Known Allergies Outpatient Encounter Medications as of 01/07/2024  Medication Sig   aspirin  EC 81 MG tablet Take 1 tablet (81 mg total) by mouth 2 (two) times daily after a meal.   docusate sodium  (COLACE) 100 MG capsule Take 1 capsule (100 mg total) by mouth 2 (two) times daily.   fluticasone  (FLONASE ) 50 MCG/ACT nasal spray USE 1 SPRAY IN EACH NOSTRIL TWICE DAILY AS NEEDED   ibuprofen (ADVIL) 200 MG tablet Take 400 mg by mouth 2 (two) times daily as needed for moderate pain (pain score 4-6).   lisinopril  (ZESTRIL ) 10 MG tablet TAKE ONE (1) TABLET BY MOUTH EVERY DAY   methocarbamol  (ROBAXIN ) 500 MG tablet Take 1 tablet (500 mg total) by mouth every 8 (eight) hours as needed for muscle spasms.   Multiple Vitamin (MULTIVITAMIN) tablet Take 1 tablet by mouth daily.   omeprazole  (PRILOSEC) 20 MG capsule Take 1 capsule (20 mg total) by mouth daily.   oxyCODONE  (OXY IR/ROXICODONE ) 5 MG immediate release tablet Take 1 tablet (5 mg total) by mouth every 4 (four) hours as needed for severe pain (pain score 7-10).   polyethylene glycol (MIRALAX  / GLYCOLAX ) 17 g packet Take 17 g by mouth  daily.   No facility-administered encounter medications on file as of 01/07/2024.    Past Surgical History:  Procedure Laterality Date   CARPAL TUNNEL WITH CUBITAL TUNNEL Right    By Dr. Camella   CHOLECYSTECTOMY     laparoscopic   EYE SURGERY Bilateral    cataract extraction w/ IOL   INGUINAL HERNIA REPAIR Right 01/19/2023   Procedure: HERNIA REPAIR INGUINAL ADULT W/ MESH;  Surgeon: Evonnie Dorothyann LABOR, DO;  Location: [facility];  Service: General;  Laterality: Right;   OTHER SURGICAL HISTORY     right shoulder surgery    OTHER SURGICAL HISTORY     right knee arthroscopic surgery    OTHER SURGICAL HISTORY  01/05/2003   ACDF  C5-6  by Dr. Gust   ROBOT ASSISTED LAPAROSCOPIC RADICAL PROSTATECTOMY  07/16/2011   Procedure: ROBOTIC ASSISTED LAPAROSCOPIC RADICAL PROSTATECTOMY;  Surgeon: Alm GORMAN Fragmin, MD;  Location: [facility];  Service: Urology;  Laterality: N/A;  Robtic Assisted Laproscopic Radical Retropubic Prostatetomy     TOTAL KNEE ARTHROPLASTY Right 05/11/2023   Procedure: TOTAL KNEE ARTHROPLASTY;  Surgeon: Duwayne Purchase, MD;  Location: [facility];  Service: Orthopedics;  Laterality: Right;    Family History  Problem Relation Age of Onset   Heart attack Father    Cancer Brother    Leukemia Brother       Controlled  substance contract: n/a     Review of Systems  Constitutional:  Negative for diaphoresis.  Eyes:  Negative for pain.  Respiratory:  Negative for shortness of breath.   Cardiovascular:  Negative for chest pain, palpitations and leg swelling.  Gastrointestinal:  Negative for abdominal pain.  Endocrine: Negative for polydipsia.  Skin:  Negative for rash.  Neurological:  Negative for dizziness, weakness and headaches.  Hematological:  Does not bruise/bleed easily.  All other systems reviewed and are negative.      Objective:   Physical Exam Vitals and nursing note reviewed.  Constitutional:      Appearance: Normal appearance. He is well-developed.  HENT:      Head: Normocephalic.     Right Ear: Hearing and tympanic membrane normal.     Left Ear: Hearing and tympanic membrane normal. A PE tube is present. Tympanic membrane is not erythematous or bulging.     Nose: Nose normal.     Mouth/Throat:     Mouth: Mucous membranes are moist.     Pharynx: Oropharynx is clear.  Eyes:     Pupils: Pupils are equal, round, and reactive to light.  Neck:     Thyroid: No thyroid mass or thyromegaly.     Vascular: No carotid bruit or JVD.     Trachea: Phonation normal.  Cardiovascular:     Rate and Rhythm: Normal rate and regular rhythm.  Pulmonary:     Effort: Pulmonary effort is normal. No respiratory distress.     Breath sounds: Normal breath sounds.  Abdominal:     General: Bowel sounds are normal.     Palpations: Abdomen is soft.     Tenderness: There is no abdominal tenderness.  Musculoskeletal:        General: Normal range of motion.     Cervical back: Normal range of motion and neck supple.  Lymphadenopathy:     Cervical: No cervical adenopathy.  Skin:    General: Skin is warm and dry.  Neurological:     Mental Status: He is alert and oriented to person, place, and time.  Psychiatric:        Behavior: Behavior normal.        Thought Content: Thought content normal.        Judgment: Judgment normal.     BP (!) 154/74   Pulse 68   Temp 97.6 F (36.4 C) (Temporal)   Ht 5' 8.5 (1.74 m)   SpO2 97%   BMI 28.11 kg/m          Assessment & Plan:  Dwayne [name] comes in today with chief complaint of annual physical  Diagnosis and orders addressed:  1. Primary hypertension Low sodium diet Continue to keep a diary of blood pressure at home - lisinopril (ZESTRIL) 10 MG tablet; TAKE ONE (1) TABLET BY MOUTH EVERY DAY  Dispense: 90 tablet; Refill: 1  2. Gastroesophageal reflux disease without esophagitis Avoid spicy foods Do not eat 2 hours prior to bedtime  - omeprazole (PRILOSEC) 20 MG capsule; Take 1 capsule (20 mg total)  by mouth daily.  Dispense: 90 capsule; Refill: 1   Orders Placed This Encounter  Procedures   CBC with Differential/Platelet   CMP14+EGFR   Lipid panel   Thyroid Panel With TSH   PSA, total and free   VITAMIN D 25 Hydroxy (Vit-D Deficiency, Fractures)       Labs pending Health Maintenance reviewed Diet and exercise encouraged  Follow up plan: 6 months  Mary-Margaret Gladis, FNP

## 2024-01-26 DIAGNOSIS — D352 Benign neoplasm of pituitary gland: Secondary | ICD-10-CM | POA: Diagnosis not present

## 2024-02-15 DIAGNOSIS — Z6828 Body mass index (BMI) 28.0-28.9, adult: Secondary | ICD-10-CM | POA: Diagnosis not present

## 2024-02-15 DIAGNOSIS — D352 Benign neoplasm of pituitary gland: Secondary | ICD-10-CM | POA: Diagnosis not present

## 2024-05-30 ENCOUNTER — Ambulatory Visit
Admission: EM | Admit: 2024-05-30 | Discharge: 2024-05-30 | Disposition: A | Discharge status: Home / Self Care | Attending: Family Medicine | Admitting: Family Medicine

## 2024-05-30 DIAGNOSIS — R030 Elevated blood-pressure reading, without diagnosis of hypertension: Secondary | ICD-10-CM

## 2024-05-30 DIAGNOSIS — R062 Wheezing: Secondary | ICD-10-CM | POA: Diagnosis not present

## 2024-05-30 DIAGNOSIS — J22 Unspecified acute lower respiratory infection: Secondary | ICD-10-CM | POA: Diagnosis not present

## 2024-05-30 MED ORDER — AZITHROMYCIN 250 MG PO TABS: ORAL_TABLET | ORAL | 0 refills | Status: AC

## 2024-05-30 MED ORDER — PROMETHAZINE-DM 6.25-15 MG/5ML PO SYRP: 5.0000 mL | ORAL_SOLUTION | Freq: Four times a day (QID) | ORAL | 0 refills | Status: AC | PRN

## 2024-05-30 MED ORDER — GUAIFENESIN ER 600 MG PO TB12: 600.0000 mg | ORAL_TABLET | Freq: Two times a day (BID) | ORAL | 0 refills | Status: AC

## 2024-05-30 MED ORDER — ALBUTEROL SULFATE HFA 108 (90 BASE) MCG/ACT IN AERS: 2.0000 | INHALATION_SPRAY | RESPIRATORY_TRACT | 0 refills | Status: AC | PRN

## 2024-05-30 NOTE — ED Provider Notes (Signed)
 " [facility]    CSN: [id number] Arrival date & time: 05/30/24  0805      History   Chief Complaint No chief complaint on file.   HPI Dwayne [name] is a 79 y.o. male.   Patient presenting today with 2-week history of congestion, cough, headache, sinus drainage, body aches.  Denies chest pain, shortness of breath, abdominal pain, vomiting, diarrhea but notes that the cough seems to be worsening and very congested now.  So far trying numerous over-the-counter cold and congestion medications all with minimal relief.  No known history of chronic pulmonary disease.    Past Medical History:  Diagnosis Date   Arthritis    Cancer of prostate (HCC)    Cancer of skin    Chronic kidney disease    prostate cancer    GERD (gastroesophageal reflux disease)    Hypertension    Pituitary adenoma (HCC)    Benign per patient.   PONV (postoperative nausea and vomiting)    Superficial basal cell carcinoma (BCC) 12/12/2020   Right Parotid Area    Patient Active Problem List   Diagnosis Date Noted   Right knee DJD 05/11/2023   Right inguinal hernia 01/19/2023   Hearing loss of left ear 12/25/2020   Gastroesophageal reflux disease without esophagitis 05/22/2020   Hypertension 07/26/2019    Past Surgical History:  Procedure Laterality Date   CARPAL TUNNEL WITH CUBITAL TUNNEL Right    By Dr. Camella   CHOLECYSTECTOMY     laparoscopic   EYE SURGERY Bilateral    cataract extraction w/ IOL   INGUINAL HERNIA REPAIR Right 01/19/2023   Procedure: HERNIA REPAIR INGUINAL ADULT W/ MESH;  Surgeon: Evonnie Dorothyann LABOR, DO;  Location: [facility];  Service: General;  Laterality: Right;   OTHER SURGICAL HISTORY     right shoulder surgery    OTHER SURGICAL HISTORY     right knee arthroscopic surgery    OTHER SURGICAL HISTORY  01/05/2003   ACDF  C5-6  by Dr. Gust   ROBOT ASSISTED LAPAROSCOPIC RADICAL PROSTATECTOMY  07/16/2011   Procedure: ROBOTIC ASSISTED LAPAROSCOPIC RADICAL  PROSTATECTOMY;  Surgeon: Alm GORMAN Fragmin, MD;  Location: [facility];  Service: Urology;  Laterality: N/A;  Robtic Assisted Laproscopic Radical Retropubic Prostatetomy     TOTAL KNEE ARTHROPLASTY Right 05/11/2023   Procedure: TOTAL KNEE ARTHROPLASTY;  Surgeon: Duwayne Purchase, MD;  Location: [facility];  Service: Orthopedics;  Laterality: Right;       Home Medications    Prior to Admission medications  Medication Sig Start Date End Date Taking? Authorizing Provider  albuterol  (VENTOLIN  HFA) 108 (90 Base) MCG/ACT inhaler Inhale 2 puffs into the lungs every 4 (four) hours as needed. 05/30/24  Yes Stuart Vernell Norris, PA-C  azithromycin  (ZITHROMAX ) 250 MG tablet Take first 2 tablets together, then 1 every day until finished. 05/30/24  Yes Stuart Vernell Norris, PA-C  guaiFENesin  (MUCINEX ) 600 MG 12 hr tablet Take 1 tablet (600 mg total) by mouth 2 (two) times daily. 05/30/24  Yes Stuart Vernell Norris, PA-C  promethazine -dextromethorphan (PROMETHAZINE -DM) 6.25-15 MG/5ML syrup Take 5 mLs by mouth 4 (four) times daily as needed. 05/30/24  Yes Stuart Vernell Norris, PA-C  aspirin  EC 81 MG tablet Take 1 tablet (81 mg total) by mouth 2 (two) times daily after a meal. 05/11/23   Duwayne Purchase, MD  fluticasone  (FLONASE ) 50 MCG/ACT nasal spray USE 1 SPRAY IN EACH NOSTRIL TWICE DAILY AS NEEDED 09/02/23   Gladis Mustard, FNP  ibuprofen (ADVIL) 200  MG tablet Take 400 mg by mouth 2 (two) times daily as needed for moderate pain (pain score 4-6).    [provider]  lisinopril  (ZESTRIL ) 10 MG tablet TAKE ONE (1) TABLET BY MOUTH EVERY DAY 01/07/24   Gladis, Mary-Margaret, FNP  methocarbamol  (ROBAXIN ) 500 MG tablet Take 1 tablet (500 mg total) by mouth every 8 (eight) hours as needed for muscle spasms. 05/11/23   Duwayne Purchase, MD  Multiple Vitamin (MULTIVITAMIN) tablet Take 1 tablet by mouth daily.    [provider]  omeprazole  (PRILOSEC) 20 MG capsule Take 1 capsule (20 mg total) by mouth daily.  01/07/24   Gladis, Mary-Margaret, FNP  oxyCODONE  (OXY IR/ROXICODONE ) 5 MG immediate release tablet Take 1 tablet (5 mg total) by mouth every 4 (four) hours as needed for severe pain (pain score 7-10). 05/11/23   Duwayne Purchase, MD  polyethylene glycol (MIRALAX  / GLYCOLAX ) 17 g packet Take 17 g by mouth daily. 05/11/23   Duwayne Purchase, MD    Family History Family History  Problem Relation Age of Onset   Heart attack Father    Cancer Brother    Leukemia Brother     Social History Social History[1]   Allergies   Patient has no known allergies.   Review of Systems Review of Systems PER HPI  Physical Exam Triage Vital Signs ED Triage Vitals  Encounter Vitals Group     BP 05/30/24 0819 (!) 165/79     Girls Systolic BP Percentile --      Girls Diastolic BP Percentile --      Boys Systolic BP Percentile --      Boys Diastolic BP Percentile --      Pulse Rate 05/30/24 0819 (!) 107     Resp 05/30/24 0819 20     Temp 05/30/24 0819 99.5 F (37.5 C)     Temp Source 05/30/24 0819 Oral     SpO2 05/30/24 0819 94 %     Weight --      Height --      Head Circumference --      Peak Flow --      Pain Score 05/30/24 0821 0     Pain Loc --      Pain Education --      Exclude from Growth Chart --    No data found.  Updated Vital Signs BP 127/73 (BP Location: Right Arm)   Pulse (!) 106   Temp 99.5 F (37.5 C) (Oral)   Resp 20   SpO2 93%   Visual Acuity Right Eye Distance:   Left Eye Distance:   Bilateral Distance:    Right Eye Near:   Left Eye Near:    Bilateral Near:     Physical Exam Vitals and nursing note reviewed.  Constitutional:      Appearance: He is well-developed.  HENT:     Head: Atraumatic.     Right Ear: External ear normal.     Left Ear: External ear normal.     Nose: Congestion present.     Mouth/Throat:     Pharynx: Posterior oropharyngeal erythema present. No oropharyngeal exudate.  Eyes:     Conjunctiva/sclera: Conjunctivae normal.      Pupils: Pupils are equal, round, and reactive to light.  Cardiovascular:     Rate and Rhythm: Normal rate and regular rhythm.  Pulmonary:     Effort: Pulmonary effort is normal. No respiratory distress.     Breath sounds: Wheezing and rales present.  Comments: Right lower crackles and wheezes Musculoskeletal:        General: Normal range of motion.     Cervical back: Normal range of motion and neck supple.  Lymphadenopathy:     Cervical: No cervical adenopathy.  Skin:    General: Skin is warm and dry.  Neurological:     Mental Status: He is alert and oriented to person, place, and time.  Psychiatric:        Behavior: Behavior normal.      UC Treatments / Results  Labs (all labs ordered are listed, but only abnormal results are displayed) Labs Reviewed - No data to display  EKG   Radiology No results found.  Procedures Procedures (including critical care time)  Medications Ordered in UC Medications - No data to display  Initial Impression / Assessment and Plan / UC Course  I have reviewed the triage vital signs and the nursing notes.  Pertinent labs & imaging results that were available during my care of the patient were reviewed by me and considered in my medical decision making (see chart for details).     Given duration worsening course of symptoms, will treat with Zithromax , albuterol , Phenergan  DM, Mucinex , supportive over-the-counter medications and home care.  Blood pressure initially elevated, possibly secondary to over-the-counter cold and congestion medications but improved on recheck.  List given of medications that will not further elevate blood pressure over-the-counter.  Return for worsening or unresolving symptoms.  Final Clinical Impressions(s) / UC Diagnoses   Final diagnoses:  Lower respiratory infection  Wheezing  Elevated blood pressure reading     Discharge Instructions      In addition to the medications, you may take Coricidin HBP,  Flonase  nasal spray, and use saline sinus rinses, humidifiers.  These will not further elevate your blood pressure like some of the other over-the-counter cold and congestion medications might.  Follow-up for worsening or unresolving symptoms.    ED Prescriptions     Medication Sig Dispense Auth. Provider   azithromycin  (ZITHROMAX ) 250 MG tablet Take first 2 tablets together, then 1 every day until finished. 6 tablet Stuart Vernell Norris, PA-C   albuterol  (VENTOLIN  HFA) 108 (90 Base) MCG/ACT inhaler Inhale 2 puffs into the lungs every 4 (four) hours as needed. 18 g Stuart Vernell Norris, PA-C   promethazine -dextromethorphan (PROMETHAZINE -DM) 6.25-15 MG/5ML syrup Take 5 mLs by mouth 4 (four) times daily as needed. 100 mL Stuart Vernell Norris, PA-C   guaiFENesin  (MUCINEX ) 600 MG 12 hr tablet Take 1 tablet (600 mg total) by mouth 2 (two) times daily. 20 tablet Stuart Vernell Norris, [place]      PDMP not reviewed this encounter.    [1]  Social History Tobacco Use   Smoking status: Former   Smokeless tobacco: Former  Building Services Engineer status: Never Used  Substance Use Topics   Alcohol use: No   Drug use: No     Stuart Vernell Norris, PA-C 05/30/24 9089  "

## 2024-05-30 NOTE — ED Triage Notes (Signed)
 Pt reports cough congestion headache, sinus drainage, body aches x 2 weeks.

## 2024-05-30 NOTE — Discharge Instructions (Signed)
 In addition to the medications, you may take Coricidin HBP, Flonase  nasal spray, and use saline sinus rinses, humidifiers.  These will not further elevate your blood pressure like some of the other over-the-counter cold and congestion medications might.  Follow-up for worsening or unresolving symptoms.

## 2024-06-02 NOTE — Progress Notes (Signed)
 Dwayne [name]                                          MRN: [id number]   06/02/2024   The [facility] Quality Team Specialist reviewed this patient medical record for the purposes of chart review for care gap closure. The following were reviewed: chart review for care gap closure-controlling blood pressure.    [facility] Quality Team

## 2024-07-11 ENCOUNTER — Ambulatory Visit: Payer: Self-pay | Admitting: Nurse Practitioner
